# Patient Record
Sex: Female | Born: 1945
Health system: Southern US, Community
[De-identification: ages and names within clinical notes are randomized; demographics above are authoritative.]

## PROBLEM LIST (undated history)

## (undated) DIAGNOSIS — N952 Postmenopausal atrophic vaginitis: Secondary | ICD-10-CM

## (undated) DIAGNOSIS — N951 Menopausal and female climacteric states: Secondary | ICD-10-CM

## (undated) DIAGNOSIS — E559 Vitamin D deficiency, unspecified: Secondary | ICD-10-CM

## (undated) DIAGNOSIS — M810 Age-related osteoporosis without current pathological fracture: Secondary | ICD-10-CM

## (undated) DIAGNOSIS — K5730 Diverticulosis of large intestine without perforation or abscess without bleeding: Secondary | ICD-10-CM

## (undated) DIAGNOSIS — L920 Granuloma annulare: Secondary | ICD-10-CM

## (undated) HISTORY — PX: COLONOSCOPY: SHX174

## (undated) HISTORY — DX: Vitamin D deficiency, unspecified: E55.9

## (undated) HISTORY — DX: Diverticulosis of large intestine without perforation or abscess without bleeding: K57.30

## (undated) HISTORY — DX: Age-related osteoporosis without current pathological fracture: M81.0

## (undated) HISTORY — DX: Postmenopausal atrophic vaginitis: N95.2

## (undated) HISTORY — DX: Granuloma annulare: L92.0

## (undated) HISTORY — DX: Menopausal and female climacteric states: N95.1

---

## 1998-01-29 ENCOUNTER — Other Ambulatory Visit: Admission: RE | Admit: 1998-01-29 | Discharge: 1998-01-29 | Payer: Self-pay | Admitting: Obstetrics and Gynecology

## 1999-02-18 ENCOUNTER — Other Ambulatory Visit: Admission: RE | Admit: 1999-02-18 | Discharge: 1999-02-18 | Payer: Self-pay | Admitting: Obstetrics and Gynecology

## 2000-02-02 ENCOUNTER — Other Ambulatory Visit: Admission: RE | Admit: 2000-02-02 | Discharge: 2000-02-02 | Payer: Self-pay | Admitting: Obstetrics and Gynecology

## 2000-11-21 DIAGNOSIS — N951 Menopausal and female climacteric states: Secondary | ICD-10-CM

## 2000-11-21 HISTORY — DX: Menopausal and female climacteric states: N95.1

## 2001-02-21 ENCOUNTER — Other Ambulatory Visit: Admission: RE | Admit: 2001-02-21 | Discharge: 2001-02-21 | Payer: Self-pay | Admitting: Obstetrics and Gynecology

## 2001-08-23 HISTORY — PX: SKIN CANCER EXCISION: SHX779

## 2002-04-11 ENCOUNTER — Other Ambulatory Visit: Admission: RE | Admit: 2002-04-11 | Discharge: 2002-04-11 | Payer: Self-pay | Admitting: Obstetrics and Gynecology

## 2003-05-28 ENCOUNTER — Other Ambulatory Visit: Admission: RE | Admit: 2003-05-28 | Discharge: 2003-05-28 | Payer: Self-pay | Admitting: Obstetrics and Gynecology

## 2004-06-29 ENCOUNTER — Ambulatory Visit: Payer: Self-pay | Admitting: Internal Medicine

## 2004-06-30 ENCOUNTER — Encounter: Admission: RE | Admit: 2004-06-30 | Discharge: 2004-06-30 | Payer: Self-pay | Admitting: Internal Medicine

## 2004-07-08 ENCOUNTER — Other Ambulatory Visit: Admission: RE | Admit: 2004-07-08 | Discharge: 2004-07-08 | Payer: Self-pay | Admitting: Obstetrics and Gynecology

## 2005-07-23 DIAGNOSIS — N952 Postmenopausal atrophic vaginitis: Secondary | ICD-10-CM

## 2005-07-23 HISTORY — DX: Postmenopausal atrophic vaginitis: N95.2

## 2005-07-30 ENCOUNTER — Other Ambulatory Visit: Admission: RE | Admit: 2005-07-30 | Discharge: 2005-07-30 | Payer: Self-pay | Admitting: Obstetrics and Gynecology

## 2006-08-02 ENCOUNTER — Other Ambulatory Visit: Admission: RE | Admit: 2006-08-02 | Discharge: 2006-08-02 | Payer: Self-pay | Admitting: Obstetrics & Gynecology

## 2006-08-10 ENCOUNTER — Ambulatory Visit: Payer: Self-pay | Admitting: Internal Medicine

## 2006-08-15 ENCOUNTER — Ambulatory Visit: Payer: Self-pay | Admitting: Internal Medicine

## 2006-08-23 DIAGNOSIS — L920 Granuloma annulare: Secondary | ICD-10-CM

## 2006-08-23 HISTORY — DX: Granuloma annulare: L92.0

## 2007-03-14 ENCOUNTER — Ambulatory Visit: Payer: Self-pay | Admitting: Internal Medicine

## 2007-05-01 ENCOUNTER — Encounter: Payer: Self-pay | Admitting: Internal Medicine

## 2007-08-30 ENCOUNTER — Other Ambulatory Visit: Admission: RE | Admit: 2007-08-30 | Discharge: 2007-08-30 | Payer: Self-pay | Admitting: Obstetrics and Gynecology

## 2007-10-18 ENCOUNTER — Encounter: Payer: Self-pay | Admitting: Internal Medicine

## 2007-11-06 ENCOUNTER — Encounter (INDEPENDENT_AMBULATORY_CARE_PROVIDER_SITE_OTHER): Payer: Self-pay | Admitting: *Deleted

## 2008-03-06 ENCOUNTER — Ambulatory Visit: Payer: Self-pay | Admitting: Internal Medicine

## 2008-03-06 DIAGNOSIS — L920 Granuloma annulare: Secondary | ICD-10-CM | POA: Insufficient documentation

## 2008-03-06 DIAGNOSIS — L538 Other specified erythematous conditions: Secondary | ICD-10-CM

## 2008-03-06 DIAGNOSIS — E785 Hyperlipidemia, unspecified: Secondary | ICD-10-CM | POA: Insufficient documentation

## 2008-03-06 DIAGNOSIS — E559 Vitamin D deficiency, unspecified: Secondary | ICD-10-CM

## 2008-05-30 ENCOUNTER — Ambulatory Visit: Payer: Self-pay | Admitting: Internal Medicine

## 2008-05-30 DIAGNOSIS — M7580 Other shoulder lesions, unspecified shoulder: Secondary | ICD-10-CM

## 2008-05-30 DIAGNOSIS — M25819 Other specified joint disorders, unspecified shoulder: Secondary | ICD-10-CM | POA: Insufficient documentation

## 2008-10-01 ENCOUNTER — Other Ambulatory Visit: Admission: RE | Admit: 2008-10-01 | Discharge: 2008-10-01 | Payer: Self-pay | Admitting: Obstetrics and Gynecology

## 2009-06-13 ENCOUNTER — Encounter: Payer: Self-pay | Admitting: Internal Medicine

## 2009-06-13 ENCOUNTER — Ambulatory Visit: Payer: Self-pay | Admitting: Family Medicine

## 2009-12-23 ENCOUNTER — Ambulatory Visit: Payer: Self-pay | Admitting: Internal Medicine

## 2009-12-23 LAB — CONVERTED CEMR LAB
ALT: 14 units/L (ref 0–35)
AST: 26 units/L (ref 0–37)
Albumin: 4 g/dL (ref 3.5–5.2)
Alkaline Phosphatase: 87 units/L (ref 39–117)
BUN: 9 mg/dL (ref 6–23)
Basophils Absolute: 0 10*3/uL (ref 0.0–0.1)
Basophils Relative: 0.5 % (ref 0.0–3.0)
Bilirubin, Direct: 0.1 mg/dL (ref 0.0–0.3)
CO2: 30 meq/L (ref 19–32)
Calcium: 9.2 mg/dL (ref 8.4–10.5)
Chloride: 104 meq/L (ref 96–112)
Cholesterol: 210 mg/dL — ABNORMAL HIGH (ref 0–200)
Creatinine, Ser: 0.5 mg/dL (ref 0.4–1.2)
Direct LDL: 124.9 mg/dL
Eosinophils Absolute: 0.1 10*3/uL (ref 0.0–0.7)
Eosinophils Relative: 1.4 % (ref 0.0–5.0)
GFR calc non Af Amer: 132.88 mL/min (ref >60)
Glucose, Bld: 93 mg/dL (ref 70–99)
HCT: 39.5 % (ref 36.0–46.0)
HDL: 65.1 mg/dL (ref >39.00)
Hemoglobin: 13.2 g/dL (ref 12.0–15.0)
Lymphocytes Relative: 21 % (ref 12.0–46.0)
Lymphs Abs: 1.4 10*3/uL (ref 0.7–4.0)
MCHC: 33.5 g/dL (ref 30.0–36.0)
MCV: 85.7 fL (ref 78.0–100.0)
Monocytes Absolute: 0.4 10*3/uL (ref 0.1–1.0)
Monocytes Relative: 5.5 % (ref 3.0–12.0)
Neutro Abs: 4.9 10*3/uL (ref 1.4–7.7)
Neutrophils Relative %: 71.6 % (ref 43.0–77.0)
Platelets: 321 10*3/uL (ref 150.0–400.0)
Potassium: 4.9 meq/L (ref 3.5–5.1)
RBC: 4.61 M/uL (ref 3.87–5.11)
RDW: 14.2 % (ref 11.5–14.6)
Sodium: 141 meq/L (ref 135–145)
TSH: 1.18 microintl units/mL (ref 0.35–5.50)
Total Bilirubin: 0.8 mg/dL (ref 0.3–1.2)
Total CHOL/HDL Ratio: 3
Total Protein: 6.5 g/dL (ref 6.0–8.3)
Triglycerides: 55 mg/dL (ref 0.0–149.0)
VLDL: 11 mg/dL (ref 0.0–40.0)
WBC: 6.8 10*3/uL (ref 4.5–10.5)

## 2010-01-07 ENCOUNTER — Ambulatory Visit: Payer: Self-pay | Admitting: Internal Medicine

## 2010-01-07 DIAGNOSIS — K5730 Diverticulosis of large intestine without perforation or abscess without bleeding: Secondary | ICD-10-CM

## 2010-01-07 DIAGNOSIS — M810 Age-related osteoporosis without current pathological fracture: Secondary | ICD-10-CM

## 2010-01-07 HISTORY — DX: Diverticulosis of large intestine without perforation or abscess without bleeding: K57.30

## 2010-06-16 ENCOUNTER — Encounter: Payer: Self-pay | Admitting: Internal Medicine

## 2010-06-25 ENCOUNTER — Ambulatory Visit: Payer: Self-pay | Admitting: Internal Medicine

## 2010-06-25 DIAGNOSIS — L509 Urticaria, unspecified: Secondary | ICD-10-CM

## 2010-06-25 DIAGNOSIS — J069 Acute upper respiratory infection, unspecified: Secondary | ICD-10-CM | POA: Insufficient documentation

## 2010-08-23 HISTORY — PX: WRIST FRACTURE SURGERY: SHX121

## 2010-09-20 LAB — CONVERTED CEMR LAB
ALT: 13 units/L (ref 0–35)
AST: 23 units/L (ref 0–37)
Albumin: 4.1 g/dL (ref 3.5–5.2)
Alkaline Phosphatase: 80 units/L (ref 39–117)
BUN: 8 mg/dL (ref 6–23)
Basophils Absolute: 0 10*3/uL (ref 0.0–0.1)
Basophils Relative: 0.3 % (ref 0.0–3.0)
Bilirubin, Direct: 0.1 mg/dL (ref 0.0–0.3)
CO2: 31 meq/L (ref 19–32)
Calcium: 9.3 mg/dL (ref 8.4–10.5)
Chloride: 104 meq/L (ref 96–112)
Cholesterol, target level: 200 mg/dL
Cholesterol: 189 mg/dL (ref 0–200)
Creatinine, Ser: 0.6 mg/dL (ref 0.4–1.2)
Eosinophils Absolute: 0.1 10*3/uL (ref 0.0–0.7)
Eosinophils Relative: 1.2 % (ref 0.0–5.0)
GFR calc Af Amer: 131 mL/min
GFR calc non Af Amer: 108 mL/min
Glucose, Bld: 97 mg/dL (ref 70–99)
HCT: 40.1 % (ref 36.0–46.0)
HDL goal, serum: 40 mg/dL
HDL: 59.8 mg/dL (ref >39.0)
Hemoglobin: 13.7 g/dL (ref 12.0–15.0)
Hgb A1c MFr Bld: 6 % (ref 4.6–6.0)
LDL Cholesterol: 118 mg/dL — ABNORMAL HIGH (ref 0–99)
LDL Goal: 160 mg/dL
Lymphocytes Relative: 21.7 % (ref 12.0–46.0)
MCHC: 34.1 g/dL (ref 30.0–36.0)
MCV: 85.4 fL (ref 78.0–100.0)
Monocytes Absolute: 0.3 10*3/uL (ref 0.1–1.0)
Monocytes Relative: 5.5 % (ref 3.0–12.0)
Neutro Abs: 4.2 10*3/uL (ref 1.4–7.7)
Neutrophils Relative %: 71.3 % (ref 43.0–77.0)
Platelets: 302 10*3/uL (ref 150–400)
Potassium: 3.8 meq/L (ref 3.5–5.1)
RBC: 4.7 M/uL (ref 3.87–5.11)
RDW: 13.6 % (ref 11.5–14.6)
Sodium: 140 meq/L (ref 135–145)
TSH: 0.83 microintl units/mL (ref 0.35–5.50)
Total Bilirubin: 0.9 mg/dL (ref 0.3–1.2)
Total CHOL/HDL Ratio: 3.2
Total Protein: 6.9 g/dL (ref 6.0–8.3)
Triglycerides: 57 mg/dL (ref 0–149)
VLDL: 11 mg/dL (ref 0–40)
WBC: 5.9 10*3/uL (ref 4.5–10.5)

## 2010-09-22 NOTE — Assessment & Plan Note (Signed)
Summary: rash around waist/kn   Vital Signs:  Patient profile:   65 year old female Height:      64 inches Weight:      159.2 pounds BMI:     27.43 Temp:     98.0 degrees F oral Pulse rate:   72 / minute Resp:     15 per minute BP sitting:   130 / 88  (left arm) Cuff size:   large  Vitals Entered By: [name] CMA (June 25, 2010 3:56 PM) CC: 1.) Rash on right side of waist(no change in daily routines)   2.) Cough and drainage, URI symptoms   CC:  1.) Rash on right side of waist(no change in daily routines)   2.) Cough and drainage and URI symptoms.  History of Present Illness:       Onset 11/02 of "welts"; she applied Neosporin.  The patient reports hives and welts, but denies blisters, ulcers, oozing,  pain and tenderness.  The rash is located on the abdomen but minor lesions L buttock & R jaw.  The rash is worse with heat of shower.  Associated symptoms include sore throat 10/29 & 10/30.  The patient denies the following symptoms: facial swelling, tongue swelling, difficulty breathing, abdominal pain, nausea, vomiting, diarrhea, dysuria, and eye symptoms.  The patient reports a history of recent infection ( see below).   RTI  Symptoms      The patient also presents with RTI  symptoms since 10/27; onset as dry cough.  The patient reports dry cough, but denies nasal congestion, purulent nasal discharge, and earache.  Associated symptoms include low-grade fever (<100.5 degrees) over weekend.  The patient denies dyspnea and wheezing.  The patient also reports sneezing.  The patient denies itchy watery eyes and headache ( minor over weekend only).  Risk factors for Strep sinusitis include tooth pain in L maxillary area.  The patient denies the following risk factors for Strep sinusitis: unilateral facial pain and tender adenopathy.  Rx: Nyquil   Current Medications (verified): 1)  Vitamin D 62952 Unit  Caps (Ergocalciferol) .Marland Kitchen.. 1 By Mouth Weekly 2)  Daily Multi   Tabs (Multiple  Vitamins-Minerals) .Marland Kitchen.. 1 By Mouth Once Daily 3)  Calcium 1500 Mg  Tabs (Calcium Carbonate) .Marland Kitchen.. 1 By Mouth Every Other Day  Allergies: 1)  ! Codeine 2)  Sulfa  Physical Exam  General:  well-nourished,in no acute distress; alert,appropriate and cooperative throughout examination Eyes:  No corneal or conjunctival inflammation noted.Perrla.  Vision grossly normal. Ears:  External ear exam shows no significant lesions or deformities.  Otoscopic examination reveals clear canals, tympanic membranes are intact bilaterally without bulging, retraction, inflammation or discharge. Hearing is grossly normal bilaterally. Nose:  External nasal examination shows no deformity or inflammation. Nasal mucosa are pink and moist without lesions or exudates. Mouth:  Oral mucosa and oropharynx without lesions or exudates.  Teeth in good repair. Lungs:  Normal respiratory effort, chest expands symmetrically. Lungs are clear to auscultation, no crackles or wheezes. Heart:  Normal rate and regular rhythm. S1 and S2 normal without gallop, murmur, click, rub or other extra sounds. Skin:  Linearly distributedd urticaria R flank , minimal lesion R mandible. Dermatographia elicitable  Cervical Nodes:  No lymphadenopathy noted Axillary Nodes:  No palpable lymphadenopathy Psych:  memory intact for recent and remote, normally interactive, and good eye contact.     Impression & Recommendations:  Problem # 1:  URTICARIA (ICD-708.9) from Nyquil  or RTI  Problem #  2:  URI (ICD-465.9)  Problem # 3:  BRONCHITIS-ACUTE (ICD-466.0)  Her updated medication list for this problem includes:    Azithromycin 250 Mg Tabs (Azithromycin) .Marland Kitchen... As per pack  Complete Medication List: 1)  Vitamin D 43329 Unit Caps (Ergocalciferol) .Marland Kitchen.. 1 by mouth weekly 2)  Daily Multi Tabs (Multiple vitamins-minerals) .Marland Kitchen.. 1 by mouth once daily 3)  Calcium 1500 Mg Tabs (Calcium carbonate) .Marland Kitchen.. 1 by mouth every other day 4)  Azithromycin 250 Mg  Tabs (Azithromycin) .... As per pack 5)  Hydroxyzine Pamoate 25 Mg Caps (Hydroxyzine pamoate) .Marland Kitchen.. 1 every 4-6 hrs as needed for itching 6)  Prednisone 20 Mg Tabs (Prednisone) .Marland Kitchen.. 1 two times a day with food  Patient Instructions: 1)  Stop the Nyquil. 2)  Drink as much  NON dairy fluid as you can tolerate for the next few days. Prescriptions: PREDNISONE 20 MG TABS (PREDNISONE) 1 two times a day with food  #10 x 0   Entered and Authorized by:   Marga Melnick MD   Signed by:   Marga Melnick MD on 06/25/2010   Method used:   Print then Give to Patient   RxID:   [id number] HYDROXYZINE PAMOATE 25 MG CAPS (HYDROXYZINE PAMOATE) 1 every 4-6 hrs as needed for itching  #30 x 0   Entered and Authorized by:   Marga Melnick MD   Signed by:   Marga Melnick MD on 06/25/2010   Method used:   Print then Give to Patient   RxID:   [id number] AZITHROMYCIN 250 MG TABS (AZITHROMYCIN) as per pack  #1 x 0   Entered and Authorized by:   Marga Melnick MD   Signed by:   Marga Melnick MD on 06/25/2010   Method used:   Print then Give to Patient   RxID:   [id number]    Orders Added: 1)  Est. Patient Level IV [60737]

## 2010-09-22 NOTE — Assessment & Plan Note (Signed)
Summary: cpx//ph   Vital Signs:  Patient profile:   65 year old female Height:      64.25 inches Weight:      165.2 pounds BMI:     28.24 Temp:     97.8 degrees F oral Pulse rate:   79 / minute Resp:     16 per minute BP sitting:   128 / 86  (left arm) Cuff size:   large  Vitals Entered By: [name] (Jan 07, 2010 8:30 AM)  CC: Lipid Management Comments REVIEWED MED LIST, PATIENT AGREED DOSE AND INSTRUCTION CORRECT  RECHECK ON B/P- 128/86   CC:  Lipid Management.  History of Present Illness: Madison Wright is here for a physical; she is having intermittent R shoulder pain .  The patient reports impaired ROM, but denies numbness, weakness, tingling, locking, stiffness, swelling, and redness.  The pain is located in the right shoulder.  The pain began gradually and without injury.  The patient describes the pain as intermittent and sharp.  The pain is better with NSAIDS(up to 3 ibuprofen as needed); it is worse with lifting.Similar symptos in 05/2008  Lipid Management History:      Positive NCEP/ATP III risk factors include female age 60 years old or older.  Negative NCEP/ATP III risk factors include no history of early menopause without estrogen hormone replacement, non-diabetic, HDL cholesterol greater than 60, no family history for ischemic heart disease, non-tobacco-user status, non-hypertensive, no ASHD (atherosclerotic heart disease), no prior stroke/TIA, no peripheral vascular disease, and no history of aortic aneurysm.     Preventive Screening-Counseling & Management  Alcohol-Tobacco     Smoking Status: never  Caffeine-Diet-Exercise     Does Patient Exercise: yes  Allergies: 1)  ! Codeine 2)  Sulfa  Past History:  Past Medical History: Granuloma Annulare, Dr Venancio Poisson Hyperlipidemia: NMR Panel 12/07: LDL 170 with 1082 total particles & 266 small dense particles, HDL 78,TG 30.. [place] goal = < 160. Diverticulosis, colon Osteopenia, Amedeo Kinsman  MD Vitamin D deficiency  Past Surgical History: G 0 P 0, Altamese Dilling MD,Gyn Denies surgical history Colonoscopy 2004: Tics  Family History: Father: COAD,smoker ages  59-56 Mother: arthritis, Osteoporosis Siblings: none Paunt colon  cancer;P aunt DM, Paternal FH CAD  Social History: No diet Occupation: Aeronautical engineer Never Smoked Alcohol use-no Regular exercise-yes: walking 30 min 2X/week  Review of Systems  The patient denies anorexia, fever, weight loss, weight gain, vision loss, decreased hearing, hoarseness, chest pain, syncope, dyspnea on exertion, peripheral edema, prolonged cough, headaches, hemoptysis, abdominal pain, melena, hematochezia, severe indigestion/heartburn, hematuria, incontinence, suspicious skin lesions, depression, unusual weight change, abnormal bleeding, enlarged lymph nodes, and angioedema.   MS:  Occasional R hip pain with intermittent radiation to R thigh. No BMD due to insurance coverage.Marland Kitchen  Physical Exam  General:  well-nourished; alert,appropriate and cooperative throughout examination Head:  Normocephalic and atraumatic without obvious abnormalities. No apparent alopecia or balding. Eyes:  No corneal or conjunctival inflammation noted.Perrla. Funduscopic exam benign, without hemorrhages, exudates or papilledema.  Ears:  External ear exam shows no significant lesions or deformities.  Otoscopic examination reveals clear canals, tympanic membranes are intact bilaterally without bulging, retraction, inflammation or discharge. Hearing is grossly normal bilaterally. Nose:  External nasal examination shows no deformity or inflammation. Nasal mucosa are pink and moist without lesions or exudates. Mouth:  Oral mucosa and oropharynx without lesions or exudates.  Teeth in good repair. Neck:  No deformities, masses, or tenderness noted. Lungs:  Normal respiratory effort, chest expands symmetrically. Lungs are clear to auscultation, no crackles or  wheezes. Heart:  Normal rate and regular rhythm. S1 and S2 normal without gallop, murmur, click, rub .S4 with slurring Abdomen:  Bowel sounds positive,abdomen soft and non-tender without masses, organomegaly or hernias noted. Genitalia:  as per Gyn Msk:  Asymmetry of thoracic musculature ,R > L Pulses:  R and L carotid,radial,dorsalis pedis and posterior tibial pulses are full and equal bilaterally Extremities:  No clubbing, cyanosis, edema, or deformity noted with normal full range of motion of all joints.   Minimal discomfort with ROM of R shoulder Neurologic:  alert & oriented X3, strength normal in all extremities, and DTRs symmetrical and normal.   Skin:  Scattered Granuloma Annulare lesions  Cervical Nodes:  No lymphadenopathy noted Axillary Nodes:  No palpable lymphadenopathy Psych:  memory intact for recent and remote, normally interactive, and good eye contact.     Impression & Recommendations:  Problem # 1:  ROUTINE GENERAL MEDICAL EXAM@HEALTH  CARE FACL (ICD-V70.0)  Problem # 2:  SHOULDER IMPINGEMENT SYNDROME, RIGHT (ICD-726.2)  Orders: Physical Therapy Referral (PT)  Problem # 3:  OSTEOPENIA (ICD-733.90)  Problem # 4:  VITAMIN D DEFICIENCY (ICD-268.9)  Problem # 5:  HYPERLIPIDEMIA (ICD-272.4) LDL goal = < 160  Problem # 6:  GRANULOMA ANNULARE (ICD-695.89)  Complete Medication List: 1)  Vitamin D 16109 Unit Caps (Ergocalciferol) .Marland Kitchen.. 1 by mouth weekly 2)  Daily Multi Tabs (Multiple vitamins-minerals) .Marland Kitchen.. 1 by mouth once daily 3)  Calcium 1500 Mg Tabs (Calcium carbonate) .Marland Kitchen.. 1 by mouth every other day 4)  Voltaren 1 % Gel (Diclofenac sodium) .... Apply two times a day  Lipid Assessment/Plan:      Based on NCEP/ATP III, the patient's risk factor category is "0-1 risk factors".  The patient's lipid goals are as follows: Total cholesterol goal is 200; LDL cholesterol goal is 160; HDL cholesterol goal is 40; Triglyceride goal is 150.  Her LDL cholesterol goal has been  met.    Patient Instructions: 1)  Consider Glucosamine 1500 mg once daily for 3 months on  & then 2 months off for the shoulder.

## 2010-09-22 NOTE — Letter (Signed)
Summary: Cancer Screening/[facility] Personalized Risk Profile  Cancer Screening/[facility] Personalized Risk Profile   Imported By: Lanelle Bal 01/13/2010 12:16:59  _____________________________________________________________________  External Attachment:    Type:   Image     Comment:   External Document

## 2010-10-30 ENCOUNTER — Encounter: Payer: Self-pay | Admitting: Internal Medicine

## 2010-10-30 ENCOUNTER — Other Ambulatory Visit (INDEPENDENT_AMBULATORY_CARE_PROVIDER_SITE_OTHER): Payer: Self-pay

## 2010-10-30 DIAGNOSIS — Z1211 Encounter for screening for malignant neoplasm of colon: Secondary | ICD-10-CM

## 2010-10-30 LAB — CONVERTED CEMR LAB
OCCULT 1: NEGATIVE
OCCULT 2: NEGATIVE
OCCULT 3: NEGATIVE

## 2010-11-11 ENCOUNTER — Telehealth: Payer: Self-pay | Admitting: Internal Medicine

## 2010-11-11 NOTE — Telephone Encounter (Signed)
Phone note completed ° °

## 2010-11-19 NOTE — Letter (Signed)
Summary: Results Follow up Letter  [place] [facility]  [address] [place], [place] [address]   Phone: [phone]  Fax: [phone]    11/11/2010 MRN: [id number]  Madison Wright [address] [place], [place]  [address]  [place]  Dear [name],  The following are the results of your recent test(s):  Test         Result    Pap Smear:        Normal _____  Not Normal _____ Comments: ______________________________________________________ Cholesterol: LDL(Bad cholesterol):         Your goal is less than:         HDL (Good cholesterol):       Your goal is more than: Comments:  ______________________________________________________ Mammogram:        Normal _____  Not Normal _____ Comments:  ___________________________________________________________________ Hemoccult:        Normal __x___  Not normal _______ Comments:    _____________________________________________________________________ Other Tests:    We routinely do not discuss normal results over the telephone.  If you desire a copy of the results, or you have any questions about this information we can discuss them at your next office visit.   Sincerely,

## 2010-12-03 ENCOUNTER — Telehealth: Payer: Self-pay | Admitting: Internal Medicine

## 2010-12-03 NOTE — Telephone Encounter (Signed)
Patient has CPX on 03/17/2011,  Has labs for 03-12-2011      What orders and codes do you need?    thanks

## 2010-12-03 NOTE — Telephone Encounter (Signed)
Lipid,Hep,BMP,CBCD,TSH,Vit.D, Urine, Stool Cards V70.0/272.4/268.9

## 2010-12-04 NOTE — Telephone Encounter (Signed)
Added info to 7/18 labs

## 2011-01-29 ENCOUNTER — Telehealth: Payer: Self-pay | Admitting: Internal Medicine

## 2011-01-29 NOTE — Telephone Encounter (Signed)
error 

## 2011-01-30 ENCOUNTER — Encounter: Payer: Self-pay | Admitting: Internal Medicine

## 2011-02-01 ENCOUNTER — Encounter: Payer: Self-pay | Admitting: Internal Medicine

## 2011-02-01 ENCOUNTER — Ambulatory Visit (INDEPENDENT_AMBULATORY_CARE_PROVIDER_SITE_OTHER): Payer: Managed Care, Other (non HMO) | Admitting: Internal Medicine

## 2011-02-01 DIAGNOSIS — S52509A Unspecified fracture of the lower end of unspecified radius, initial encounter for closed fracture: Secondary | ICD-10-CM

## 2011-02-01 DIAGNOSIS — S52599A Other fractures of lower end of unspecified radius, initial encounter for closed fracture: Secondary | ICD-10-CM

## 2011-02-01 DIAGNOSIS — M899 Disorder of bone, unspecified: Secondary | ICD-10-CM

## 2011-02-01 DIAGNOSIS — M949 Disorder of cartilage, unspecified: Secondary | ICD-10-CM

## 2011-02-01 DIAGNOSIS — E559 Vitamin D deficiency, unspecified: Secondary | ICD-10-CM

## 2011-02-01 NOTE — Progress Notes (Signed)
  Subjective:    Patient ID: Madison Wright, female    DOB: 1946-01-08, 65 y.o.   MRN: [id number]  HPI  Onset:6/8; she tripped on [place]  @ [place]. There was no Cardiac or Neuro prodrome prior to fall. Trigger/injury:L distal radius fracture Pain quality: now aching Pain severity:up to 5 Exacerbating factors:none; wrist immobilized Review of systems: Constitutional: fever, chills, sweats :no Skin:rash, color change:no Neuro:weakness:no; numbness and tingling:no Treatment/response:Vicodin X 5 over weekend; now NSAIDS up to 5/day Films reviewed ; no significant displacement  PMH of Osteopenia    Review of Systems     Objective:   Physical Exam Gen.: Healthy and well-nourished in appearance. Alert, appropriate and cooperative throughout exam. Lungs: Normal respiratory effort; chest expands symmetrically. Lungs are clear to auscultation without rales, wheezes, or increased work of breathing. Heart: Normal rate and rhythm. Normal S1 and S2. No gallop, click, or rub. No murmur. No clubbing, cyanosis, edema, or deformity noted.  LUE in wrap & sling. Nail health  good. Neurologic: Alert and oriented x3.        Skin: Intact without suspicious lesions or rashes.Abrasions L knee w/o cellulitis Lymph: No cervical, axillary lymphadenopathy present. Psych: Mood and affect are normal. Normally interactive                                                                                         Assessment & Plan:  #1 fracture L distal radius Plan : Ortho referral

## 2011-02-01 NOTE — Patient Instructions (Addendum)
Ortho appt will be scheduled through [employer]; you will be contacted via cell phone  [phone])

## 2011-02-01 NOTE — Assessment & Plan Note (Signed)
Vitamin D3 50,000 IU every other week as per Rock Nephew, PA

## 2011-02-02 ENCOUNTER — Encounter (HOSPITAL_BASED_OUTPATIENT_CLINIC_OR_DEPARTMENT_OTHER)
Admission: RE | Admit: 2011-02-02 | Discharge: 2011-02-02 | Disposition: A | Discharge status: Home / Self Care | Payer: Managed Care, Other (non HMO) | Source: Ambulatory Visit | Attending: Orthopedic Surgery | Admitting: Orthopedic Surgery

## 2011-02-03 ENCOUNTER — Ambulatory Visit (HOSPITAL_BASED_OUTPATIENT_CLINIC_OR_DEPARTMENT_OTHER)
Admission: RE | Admit: 2011-02-03 | Discharge: 2011-02-03 | Disposition: A | Discharge status: Home / Self Care | Payer: Managed Care, Other (non HMO) | Source: Ambulatory Visit | Attending: Orthopedic Surgery | Admitting: Orthopedic Surgery

## 2011-02-03 DIAGNOSIS — X58XXXA Exposure to other specified factors, initial encounter: Secondary | ICD-10-CM | POA: Insufficient documentation

## 2011-02-03 DIAGNOSIS — Z01812 Encounter for preprocedural laboratory examination: Secondary | ICD-10-CM | POA: Insufficient documentation

## 2011-02-03 DIAGNOSIS — S52599A Other fractures of lower end of unspecified radius, initial encounter for closed fracture: Secondary | ICD-10-CM | POA: Insufficient documentation

## 2011-02-03 LAB — POCT HEMOGLOBIN-HEMACUE: Hemoglobin: 13.4 g/dL (ref 12.0–15.0)

## 2011-02-06 NOTE — Op Note (Signed)
NAMECORINTHIAN, Madison Wright NO.:  [id number]  MEDICAL RECORD NO.:  [id number]  LOCATION:                                 FACILITY:  PHYSICIAN:  Feliberto Gottron. Turner Daniels, M.D.        DATE OF BIRTH:  DATE OF PROCEDURE:  02/03/2011 DATE OF DISCHARGE:                              OPERATIVE REPORT   PREOPERATIVE DIAGNOSIS:  Left distal radius fracture, displaced.  POSTOPERATIVE DIAGNOSIS:  Left distal radius fracture, displaced.  PROCEDURE:  Open reduction and internal fixation, left distal radius fracture, using a small left DePuy DVR plate.  SURGEON:  Feliberto Gottron. Turner Daniels, MD.  FIRST ASSISTANT:  Shirl Harris, PA.  ANESTHETIC:  Ax block plus general LMA.  ESTIMATED BLOOD LOSS:  Minimal.  FLUID REPLACEMENT:  800 mL of crystalloid.  DRAINS PLACED:  None.  TOURNIQUET TIME:  39 minutes.  INDICATIONS FOR PROCEDURE:  A 65 year old woman who sustained a displaced left distal radius fracture last week while on vacation, she returned to [facility] and came to our office with radiographs showing an angulated displaced left distal radius fracture and history of prior distal radius fracture when she was in her 11s.  She is 65 now.  In any event, because of the angulation and shortening, she was taken to the operating room for open reduction and internal fixation.  She has about 5 mm ulnar nerve positive.  Risks and benefits of surgery have been discussed, questions answered.  DESCRIPTION OF PROCEDURE:  The patient was identified by armband, received preoperative IV antibiotics in the holding area at [facility], taken to the operating room #5, appropriate site monitors were attached, and general LMA anesthesia was induced.  Prior to going back, she did have an axillary block anesthetic placed in the block area.  Tourniquet was applied to the left forearm and the left upper extremity was prepped and draped in sterile fashion from the fingertips to the tourniquet.   Time-out procedure performed.  Limb wrapped with an Esmarch bandage.  Tourniquet inflated to 250 mmHg.  We began the procedure by making a volar longitudinal incision starting at the wrist flexion crease and following the course of the FCR tendon for a distance of about 7 or 8 cm.  Small bleeders in the skin and subcutaneous tissue identified and cauterized.  We opened the sheath of the FCR tendon, retracted the FCR radially and went to the back side of the sheath down onto the pronator quadratus muscle.  This was taken down off of its radial origin, exposing the distal radius and the distal radius fracture.  Under direct visualization, the fracture was reduced with traction and volar pressure on the distal fragment.  This allowed [place] to provisionally placed a small DVR plate on the fracture site.  At this time, we noted that the old fracture given some rotation of the distal radius, so the stem of the small DVR plate was angularly rotated about 10 degrees using bending irons allowing a more confirmation fit. Satisfied with the fit, the plate was provisionally fixed proximally using the glide hole and a 12-mm 3.5 screw.  Distally, we provisionally fixed with  a guidewire and then C-arm images were taken to confirm good reduction of the fracture neutral angulation and that the plate was not in the distal RU joint.  Satisfied with the position of plate and the guidewire, we then filled the distal holes and a small plate with lag screws to the proximal holes in the distal aspect of the plate with pegs and then filled the holes proximal to the fracture site with two more bicortical lag screws.  Final C-arm images were taken confirming good position of the plate and then none of the pins were in the joint.  The wrist was taken through a full range of motion, no crepitus was noted. At this point, the tourniquet was let down.  Small bleeders identified and cauterized.  The subcutaneous tissue  closed with running 3-0 Vicryl suture and the subcuticular tissue with running 3-0 Vicryl suture as well.  Dressing of Xeroform, 4x4s, Webril, and a well-formed splint was then applied.  Tourniquet let down.  The patient awakened, extubated, and taken to the recovery room without difficulty.     Feliberto Gottron. Turner Daniels, M.D.     Ovid Curd  D:  02/03/2011  T:  02/04/2011  Job:  [id number]  Electronically Signed by Gean Birchwood M.D. on 02/06/2011 08:45:17 PM

## 2011-03-09 ENCOUNTER — Other Ambulatory Visit: Payer: Self-pay | Admitting: Internal Medicine

## 2011-03-09 DIAGNOSIS — Z0000 Encounter for general adult medical examination without abnormal findings: Secondary | ICD-10-CM

## 2011-03-09 DIAGNOSIS — E785 Hyperlipidemia, unspecified: Secondary | ICD-10-CM

## 2011-03-09 DIAGNOSIS — E559 Vitamin D deficiency, unspecified: Secondary | ICD-10-CM

## 2011-03-10 ENCOUNTER — Other Ambulatory Visit (INDEPENDENT_AMBULATORY_CARE_PROVIDER_SITE_OTHER): Payer: Managed Care, Other (non HMO)

## 2011-03-10 DIAGNOSIS — E785 Hyperlipidemia, unspecified: Secondary | ICD-10-CM

## 2011-03-10 DIAGNOSIS — E559 Vitamin D deficiency, unspecified: Secondary | ICD-10-CM

## 2011-03-10 DIAGNOSIS — Z0000 Encounter for general adult medical examination without abnormal findings: Secondary | ICD-10-CM

## 2011-03-10 LAB — BASIC METABOLIC PANEL
BUN: 10 mg/dL (ref 6–23)
CO2: 27 mEq/L (ref 19–32)
Calcium: 9.1 mg/dL (ref 8.4–10.5)
Chloride: 104 mEq/L (ref 96–112)
Creatinine, Ser: 0.5 mg/dL (ref 0.4–1.2)
GFR: 126.5 mL/min (ref >60.00)
Glucose, Bld: 110 mg/dL — ABNORMAL HIGH (ref 70–99)
Potassium: 4.1 mEq/L (ref 3.5–5.1)
Sodium: 139 mEq/L (ref 135–145)

## 2011-03-10 LAB — CBC WITH DIFFERENTIAL/PLATELET
Basophils Absolute: 0.1 10*3/uL (ref 0.0–0.1)
Basophils Relative: 0.5 % (ref 0.0–3.0)
Eosinophils Absolute: 0 10*3/uL (ref 0.0–0.7)
Hemoglobin: 13.3 g/dL (ref 12.0–15.0)
Lymphocytes Relative: 10.1 % — ABNORMAL LOW (ref 12.0–46.0)
MCHC: 33.4 g/dL (ref 30.0–36.0)
MCV: 85.3 fl (ref 78.0–100.0)
Monocytes Absolute: 0.5 10*3/uL (ref 0.1–1.0)
Neutro Abs: 10.3 10*3/uL — ABNORMAL HIGH (ref 1.4–7.7)
Neutrophils Relative %: 84.8 % — ABNORMAL HIGH (ref 43.0–77.0)
Platelets: 306 10*3/uL (ref 150.0–400.0)
RBC: 4.65 Mil/uL (ref 3.87–5.11)
RDW: 14.7 % — ABNORMAL HIGH (ref 11.5–14.6)

## 2011-03-10 LAB — LIPID PANEL
LDL Cholesterol: 106 mg/dL — ABNORMAL HIGH (ref 0–99)
Total CHOL/HDL Ratio: 3
Triglycerides: 43 mg/dL (ref 0.0–149.0)
VLDL: 8.6 mg/dL (ref 0.0–40.0)

## 2011-03-10 LAB — HEPATIC FUNCTION PANEL
ALT: 16 U/L (ref 0–35)
AST: 23 U/L (ref 0–37)
Albumin: 4.5 g/dL (ref 3.5–5.2)
Alkaline Phosphatase: 92 U/L (ref 39–117)
Total Bilirubin: 1 mg/dL (ref 0.3–1.2)
Total Protein: 7.7 g/dL (ref 6.0–8.3)

## 2011-03-10 LAB — TSH: TSH: 0.52 u[IU]/mL (ref 0.35–5.50)

## 2011-03-10 NOTE — Progress Notes (Signed)
Labs only

## 2011-03-13 LAB — VITAMIN D 1,25 DIHYDROXY
Vitamin D 1, 25 (OH)2 Total: 64 pg/mL (ref 18–72)
Vitamin D2 1, 25 (OH)2: 33 pg/mL
Vitamin D3 1, 25 (OH)2: 31 pg/mL

## 2011-03-15 LAB — HEMOGLOBIN A1C: Hgb A1c MFr Bld: 6.1 % (ref 4.6–6.5)

## 2011-03-17 ENCOUNTER — Encounter: Payer: Self-pay | Admitting: Internal Medicine

## 2011-03-23 ENCOUNTER — Encounter: Payer: Self-pay | Admitting: Internal Medicine

## 2011-03-23 ENCOUNTER — Ambulatory Visit (INDEPENDENT_AMBULATORY_CARE_PROVIDER_SITE_OTHER): Payer: Managed Care, Other (non HMO) | Admitting: Internal Medicine

## 2011-03-23 DIAGNOSIS — E559 Vitamin D deficiency, unspecified: Secondary | ICD-10-CM

## 2011-03-23 DIAGNOSIS — D72829 Elevated white blood cell count, unspecified: Secondary | ICD-10-CM

## 2011-03-23 DIAGNOSIS — E785 Hyperlipidemia, unspecified: Secondary | ICD-10-CM

## 2011-03-23 DIAGNOSIS — R7309 Other abnormal glucose: Secondary | ICD-10-CM

## 2011-03-23 DIAGNOSIS — R42 Dizziness and giddiness: Secondary | ICD-10-CM

## 2011-03-23 NOTE — Progress Notes (Signed)
  Subjective:    Patient ID: Madison Wright, female    DOB: 1945/11/02, 65 y.o.   MRN: [id number]  HPI   Lab studies were reviewed. LDL is minimally elevated, but the HDL is protective at 75.8. There is no significant family history or premature coronary disease. She denies chest pain, palpitations, dyspnea or claudication.  Fasting blood sugar was mildly elevated at 110. She denies excessive thirst, hunger, or urination. A paternal aunt had  Diabetes. A1c was 6.1%  Her white blood count was 12,200. She denies fever, chills, sweats purulent secretions, dysuria, pyuria or hematuria. She did have a urinary tract infection in May of this year.  TSH is low normal at 0.52; it has ranged from 0.5-1.18 in the last 3 years.    Review of Systems For 2 weeks she's had intermittent dizziness when she rises up in bed. This does not occur when she turns from side to side. She's had no ear pain, discharge, hearing loss, or ringing in the ears. This has responded to over-the-counter motion sickness medication.   Objective:   Physical Exam Gen. appearance: Well-nourished, in no distress Eyes: Extraocular motion intact, field of vision normal, vision grossly intact, no nystagmus ENT: Canals clear, tympanic membranes normal, tuning fork exam normal, hearing grossly normal Neck: Normal range of motion, no masses, normal thyroid Cardiovascular: Rate and rhythm normal; no gallops or extra heart sounds. S4 w/o murmur Muscle skeletal: Range of motion, tone, &  strength normal Neuro:no cranial nerve deficit, deep tendon  reflexes normal, gait normal. Romberg negative ; finger to nose normal. She lay back and set up without symptoms. Lymph: No cervical or axillary LA Skin: Warm and dry without suspicious lesions or rashes Psych: no anxiety or mood change. Normally interactive and cooperative.        Assessment & Plan:  #1 fasting hyperglycemia in context family history diabetes in a paternal aunt  #2 minimal  elevation of LDL; HDL is protective  #3 leukocytosis with no history to suggest localizing signs of infection  #4 dizziness, postural. This is responded to over-the-counter medications.  Plan: #1 monitor  A1c every 6 months  #2 repeat the CBC and differential to assess the white count and differential.

## 2011-03-23 NOTE — Assessment & Plan Note (Signed)
LDL 106; HDL 75.80

## 2011-03-23 NOTE — Patient Instructions (Addendum)
Diabetes Monitor  The A1c test is used primarily to monitor the glucose control of diabetics over time. The goal of those with diabetes is to keep their blood glucose levels as close to normal as possible. This helps to minimize the complications caused by chronically elevated glucose levels, such as progressive damage to body organs like the kidneys, eyes, cardiovascular system, and nerves. The A1c test gives a picture of the average amount of glucose in the blood over the last few months. It can help a patient and his doctor know if the measures they are taking to control the patient's diabetes are successful or need to be adjusted.     NORMAL VALUES  Non diabetic adults: 5 %-6.1%  Good diabetic control: 6.2-6.4 %  Fair diabetic control: 6.5-7%  Poor diabetic control: greater than 7 % ( except with additional factors such as  advanced age; significant coronary or neurologic disease,etc). Check the A1c every 6 months as  it is < 6.5%. Eat a low-fat diet with lots of fruits and vegetables, up to 7-9 servings per day. Avoid obesity; your goal is waist measurement <35 inches.Consume less than 30 grams of sugar per day from foods & drinks with High Fructose Corn Sugar as #1,2,3 or # 4 on label. Follow the low carb nutrition program in [employer] as closely as possible to prevent Diabetes progression & complications. White carbohydrates (potatoes, rice, bread, and pasta) have a high spike of sugar and a high load of sugar. For example a  baked potato has a cup of sugar and a  french fry  2 teaspoons of sugar. Yams, wild  rice, whole grained bread &  wheat pasta have been much lower spike and load of  sugar. Portions should be the size of a deck of cards or your palm.   Check TSH in 6 months.  Go to Web MD for Benign  Positional Vertigo.Isometric exercises prior to standing up from bed as discussed.

## 2011-03-24 LAB — CBC WITH DIFFERENTIAL/PLATELET
Basophils Absolute: 0 10*3/uL (ref 0.0–0.1)
Basophils Relative: 0.4 % (ref 0.0–3.0)
Eosinophils Absolute: 0.1 10*3/uL (ref 0.0–0.7)
HCT: 40 % (ref 36.0–46.0)
Hemoglobin: 13 g/dL (ref 12.0–15.0)
Lymphocytes Relative: 24 % (ref 12.0–46.0)
Lymphs Abs: 1.9 10*3/uL (ref 0.7–4.0)
MCHC: 32.5 g/dL (ref 30.0–36.0)
MCV: 85.8 fl (ref 78.0–100.0)
Monocytes Absolute: 0.4 10*3/uL (ref 0.1–1.0)
Monocytes Relative: 4.7 % (ref 3.0–12.0)
Neutro Abs: 5.4 10*3/uL (ref 1.4–7.7)
Neutrophils Relative %: 69.3 % (ref 43.0–77.0)
Platelets: 309 10*3/uL (ref 150.0–400.0)
RBC: 4.66 Mil/uL (ref 3.87–5.11)
RDW: 15.8 % — ABNORMAL HIGH (ref 11.5–14.6)
WBC: 7.8 10*3/uL (ref 4.5–10.5)

## 2011-04-13 ENCOUNTER — Encounter: Payer: Self-pay | Admitting: Internal Medicine

## 2011-11-16 LAB — HM PAP SMEAR: HM Pap smear: NEGATIVE

## 2012-02-29 ENCOUNTER — Telehealth: Payer: Self-pay | Admitting: Internal Medicine

## 2012-02-29 MED ORDER — ZOSTER VACCINE LIVE 19400 UNT/0.65ML ~~LOC~~ SOLR: 0.6500 mL | Freq: Once | SUBCUTANEOUS | Status: DC

## 2012-02-29 NOTE — Telephone Encounter (Signed)
Patient called insurance and they will only cover vaccination for shingles when given by pharmacy. Please send a prescription to  [facility] ph# [phone] [facility] - [place], [place] - [address] Added to patients demographics  Per call from patient they do have in stock

## 2012-02-29 NOTE — Telephone Encounter (Signed)
RX sent

## 2012-03-02 ENCOUNTER — Ambulatory Visit: Payer: Managed Care, Other (non HMO)

## 2012-05-02 ENCOUNTER — Ambulatory Visit (INDEPENDENT_AMBULATORY_CARE_PROVIDER_SITE_OTHER): Payer: BC Managed Care – PPO | Admitting: Internal Medicine

## 2012-05-02 ENCOUNTER — Encounter: Payer: Self-pay | Admitting: Internal Medicine

## 2012-05-02 VITALS — BP 130/80 | HR 82 | Temp 97.8°F | Resp 12 | Ht 64.0 in | Wt 147.8 lb

## 2012-05-02 DIAGNOSIS — E785 Hyperlipidemia, unspecified: Secondary | ICD-10-CM

## 2012-05-02 DIAGNOSIS — M899 Disorder of bone, unspecified: Secondary | ICD-10-CM

## 2012-05-02 DIAGNOSIS — L920 Granuloma annulare: Secondary | ICD-10-CM | POA: Insufficient documentation

## 2012-05-02 DIAGNOSIS — Z0000 Encounter for general adult medical examination without abnormal findings: Secondary | ICD-10-CM

## 2012-05-02 DIAGNOSIS — M949 Disorder of cartilage, unspecified: Secondary | ICD-10-CM

## 2012-05-02 LAB — HEPATIC FUNCTION PANEL
ALT: 16 U/L (ref 0–35)
AST: 29 U/L (ref 0–37)
Albumin: 4.4 g/dL (ref 3.5–5.2)
Alkaline Phosphatase: 77 U/L (ref 39–117)
Total Bilirubin: 0.5 mg/dL (ref 0.3–1.2)
Total Protein: 7.3 g/dL (ref 6.0–8.3)

## 2012-05-02 LAB — TSH: TSH: 0.81 u[IU]/mL (ref 0.35–5.50)

## 2012-05-02 LAB — BASIC METABOLIC PANEL
CO2: 28 mEq/L (ref 19–32)
Calcium: 9.2 mg/dL (ref 8.4–10.5)
Chloride: 101 mEq/L (ref 96–112)
Creatinine, Ser: 0.4 mg/dL (ref 0.4–1.2)
GFR: 156.95 mL/min (ref >60.00)
Glucose, Bld: 100 mg/dL — ABNORMAL HIGH (ref 70–99)
Potassium: 3.8 mEq/L (ref 3.5–5.1)
Sodium: 138 mEq/L (ref 135–145)

## 2012-05-02 LAB — CBC WITH DIFFERENTIAL/PLATELET
Basophils Relative: 0.7 % (ref 0.0–3.0)
Eosinophils Absolute: 0 10*3/uL (ref 0.0–0.7)
Eosinophils Relative: 0.6 % (ref 0.0–5.0)
Hemoglobin: 13.2 g/dL (ref 12.0–15.0)
Lymphocytes Relative: 21.5 % (ref 12.0–46.0)
Lymphs Abs: 1.4 10*3/uL (ref 0.7–4.0)
MCHC: 32.4 g/dL (ref 30.0–36.0)
MCV: 86.3 fl (ref 78.0–100.0)
Monocytes Absolute: 0.4 10*3/uL (ref 0.1–1.0)
Monocytes Relative: 6.7 % (ref 3.0–12.0)
Neutro Abs: 4.5 10*3/uL (ref 1.4–7.7)
Neutrophils Relative %: 70.5 % (ref 43.0–77.0)
Platelets: 311 10*3/uL (ref 150.0–400.0)
RBC: 4.71 Mil/uL (ref 3.87–5.11)
RDW: 14.3 % (ref 11.5–14.6)

## 2012-05-02 LAB — LIPID PANEL
Cholesterol: 189 mg/dL (ref 0–200)
HDL: 75.4 mg/dL (ref >39.00)
LDL Cholesterol: 102 mg/dL — ABNORMAL HIGH (ref 0–99)
Total CHOL/HDL Ratio: 3
VLDL: 12 mg/dL (ref 0.0–40.0)

## 2012-05-02 NOTE — Progress Notes (Signed)
Subjective:    Patient ID: CHEVELLE COULSON, female    DOB: 08/01/1946, 66 y.o.   MRN: [id number]  HPI  Mrs. Quito  is here for a physical; she denies significant acute issues .      Review of Systems Approximately 2 times a week she notices some incomplete stool inflammation requiring a return to the bathroom  30 minutes after BM. There is no significant retained stool. She denies abdominal pain, unexplained weight loss, melena, or rectal bleeding. Her last colonoscopy was 2004 and revealed diverticulosis.  Her gynecologist office  prescribed 50,000 international units of vitamin D 3 every other week. By voice report her vitamin D level was therapeutic in May of this year. She is due for a bone mineral density study. She did have surgical repair of a fracture of L wrist last year after catching foot on uneven surface . Remotely she fractured the same wrist after slipping in water ; surgery was not necessary .     Objective:   Physical Exam Gen.:  well-nourished in appearance. Alert, appropriate and cooperative throughout exam. Head: Normocephalic without obvious abnormalities Eyes: No corneal or conjunctival inflammation noted. Pupils equal round reactive to light and accommodation. Fundal exam is benign without hemorrhages, exudate, papilledema. Extraocular motion intact. Vision grossly normal with lenses. Ears: External  ear exam reveals no significant lesions or deformities. Canals clear .TMs normal. Hearing is grossly normal bilaterally. Nose: External nasal exam reveals no deformity or inflammation. Nasal mucosa are pink and moist. No lesions or exudates noted.   Mouth: Oral mucosa and oropharynx reveal no lesions or exudates. Teeth in good repair. Neck: No deformities, masses, or tenderness noted. Range of motion & Thyroid normal Lungs: Normal respiratory effort; chest expands symmetrically. Lungs are clear to auscultation without rales, wheezes, or increased work of breathing. Heart:  Normal rate and rhythm. Normal S1 and S2. No gallop, click, or rub. S4 w/o murmur. Abdomen: Bowel sounds normal; abdomen soft and nontender. No masses, organomegaly or hernias noted. Genitalia: Rock Nephew, NP                                                                                   Musculoskeletal/extremities: No deformity or scoliosis noted of  the thoracic or lumbar spine. No clubbing, cyanosis, edema, or deformity noted. Range of motion  normal .Tone & strength  normal.Joints normal. Nail health  good. Vascular: Carotid, radial artery, dorsalis pedis and  posterior tibial pulses are full and equal. No bruits present. Neurologic: Alert and oriented x3. Deep tendon reflexes symmetrical and normal.          Skin: scattered granuloma annulare lesions. Lymph: No cervical, axillary lymphadenopathy present. Psych: Mood and affect are normal. Normally interactive  Assessment & Plan:  #1 comprehensive physical exam; no acute findings #2 see Problem List with Assessments & Recommendations Plan: see Orders

## 2012-05-02 NOTE — Patient Instructions (Addendum)
Preventive Health Care: Exercise  30-45  minutes a day, 3-4 days a week. Walking is especially valuable in preventing Osteoporosis. To increase roughage in your diet eat a low-fat diet with lots of fruits and vegetables, up to 7-9 servings per day.  Metamucil or other such agents as needed for stool elimination. Complete the cleansing with a Tucks or Baby Wipe.  Health Care Power of Attorney & Living Will place you in charge of your health care  decisions. Verify these are  in place. If you activate My Chart; the results can be released to you as soon as they populate from the lab. If you choose not to use this program; the labs have to be reviewed, copied & mailed causing a delay in getting the results to you.

## 2012-08-08 ENCOUNTER — Ambulatory Visit (INDEPENDENT_AMBULATORY_CARE_PROVIDER_SITE_OTHER): Payer: BC Managed Care – PPO

## 2012-08-08 DIAGNOSIS — Z23 Encounter for immunization: Secondary | ICD-10-CM

## 2012-09-13 ENCOUNTER — Encounter: Payer: Self-pay | Admitting: Internal Medicine

## 2012-10-10 ENCOUNTER — Telehealth: Payer: Self-pay | Admitting: Internal Medicine

## 2012-10-10 DIAGNOSIS — Z1211 Encounter for screening for malignant neoplasm of colon: Secondary | ICD-10-CM

## 2012-10-10 NOTE — Telephone Encounter (Signed)
Referral placed, referral coordinator will contact patient with appointment date/time

## 2012-10-10 NOTE — Telephone Encounter (Signed)
pt needs referral for colonoscopy (due to insurance requirements) sent to [facility] DR Brodie--cb# [phone]

## 2012-10-11 ENCOUNTER — Ambulatory Visit (AMBULATORY_SURGERY_CENTER): Payer: BC Managed Care – PPO | Admitting: *Deleted

## 2012-10-11 ENCOUNTER — Encounter: Payer: Self-pay | Admitting: Internal Medicine

## 2012-10-11 VITALS — Ht 63.5 in | Wt 154.6 lb

## 2012-10-11 DIAGNOSIS — Z1211 Encounter for screening for malignant neoplasm of colon: Secondary | ICD-10-CM

## 2012-10-11 MED ORDER — MOVIPREP 100 G PO SOLR: ORAL | Status: DC

## 2012-10-25 ENCOUNTER — Ambulatory Visit (AMBULATORY_SURGERY_CENTER): Payer: BC Managed Care – PPO | Admitting: Internal Medicine

## 2012-10-25 ENCOUNTER — Encounter: Payer: Self-pay | Admitting: Internal Medicine

## 2012-10-25 VITALS — BP 126/79 | HR 76 | Temp 97.8°F | Resp 15 | Ht 63.0 in | Wt 154.0 lb

## 2012-10-25 DIAGNOSIS — Z1211 Encounter for screening for malignant neoplasm of colon: Secondary | ICD-10-CM

## 2012-10-25 MED ORDER — SODIUM CHLORIDE 0.9 % IV SOLN: 500.0000 mL | INTRAVENOUS | Status: DC

## 2012-10-25 NOTE — Patient Instructions (Addendum)
YOU HAD AN ENDOSCOPIC PROCEDURE TODAY AT THE [facility]: Refer to the procedure report that was given to you for any specific questions about what was found during the examination.  If the procedure report does not answer your questions, please call your gastroenterologist to clarify.  If you requested that your care partner not be given the details of your procedure findings, then the procedure report has been included in a sealed envelope for you to review at your convenience later.  YOU SHOULD EXPECT: Some feelings of bloating in the abdomen. Passage of more gas than usual.  Walking can help get rid of the air that was put into your GI tract during the procedure and reduce the bloating. If you had a lower endoscopy (such as a colonoscopy or flexible sigmoidoscopy) you may notice spotting of blood in your stool or on the toilet paper. If you underwent a bowel prep for your procedure, then you may not have a normal bowel movement for a few days.  DIET: Your first meal following the procedure should be a light meal and then it is ok to progress to your normal diet.  A half-sandwich or bowl of soup is an example of a good first meal.  Heavy or fried foods are harder to digest and may make you feel nauseous or bloated.  Likewise meals heavy in dairy and vegetables can cause extra gas to form and this can also increase the bloating.  Drink plenty of fluids but you should avoid alcoholic beverages for 24 hours.  ACTIVITY: Your care partner should take you home directly after the procedure.  You should plan to take it easy, moving slowly for the rest of the day.  You can resume normal activity the day after the procedure however you should NOT DRIVE or use heavy machinery for 24 hours (because of the sedation medicines used during the test).    SYMPTOMS TO REPORT IMMEDIATELY: A gastroenterologist can be reached at any hour.  During normal business hours, 8:30 AM to 5:00 PM Monday through Friday,  call [phone].  After hours and on weekends, please call the GI answering service at [phone] who will take a message and have the physician on call contact you.   Following lower endoscopy (colonoscopy or flexible sigmoidoscopy):  Excessive amounts of blood in the stool  Significant tenderness or worsening of abdominal pains  Swelling of the abdomen that is new, acute  Fever of 100F or higher  FOLLOW UP: If any biopsies were taken you will be contacted by phone or by letter within the next 1-3 weeks.  Call your gastroenterologist if you have not heard about the biopsies in 3 weeks.  Our staff will call the home number listed on your records the next business day following your procedure to check on you and address any questions or concerns that you may have at that time regarding the information given to you following your procedure. This is a courtesy call and so if there is no answer at the home number and we have not heard from you through the emergency physician on call, we will assume that you have returned to your regular daily activities without incident.  SIGNATURES/CONFIDENTIALITY: You and/or your care partner have signed paperwork which will be entered into your electronic medical record.  These signatures attest to the fact that that the information above on your After Visit Summary has been reviewed and is understood.  Full responsibility of the confidentiality of this   discharge information lies with you and/or your care-partner.  Diverticulosis-handout given  High Fiber Diet-handout given  Repeat colonoscopy in 10 years  

## 2012-10-25 NOTE — Op Note (Signed)
[facility] [address] [place], [address]   COLONOSCOPY PROCEDURE REPORT  PATIENT: Madison Wright, Madison Wright  MR#: [id number] BIRTHDATE: [birth year] , 66  yrs. old GENDER: Female ENDOSCOPIST: Hart Carwin, MD REFERRED BY:  10 year recall PROCEDURE DATE:  10/25/2012 PROCEDURE:   Colonoscopy, screening ASA CLASS:   Class II INDICATIONS:Average risk patient for colon cancer and last colon 2004. MEDICATIONS: MAC sedation, administered by CRNA and propofol (Diprivan) 200mg  IV  DESCRIPTION OF PROCEDURE:   After the risks and benefits and of the procedure were explained, informed consent was obtained.  A digital rectal exam revealed no abnormalities of the rectum.    The LB [id number]-[id number]  endoscope was introduced through the anus and advanced to the cecum, which was identified by both the appendix and ileocecal valve .  The quality of the prep was good, using MoviPrep .  The instrument was then slowly withdrawn as the colon was fully examined.     COLON FINDINGS: Mild diverticulosis was noted.     Retroflexed views revealed no abnormalities.     The scope was then withdrawn from the patient and the procedure completed.  COMPLICATIONS: There were no complications. ENDOSCOPIC IMPRESSION: Mild diverticulosis was noted in the sigmoid colon  RECOMMENDATIONS: High fiber diet   REPEAT EXAM: In 10 year(s)  for Colonoscopy.  cc:  _______________________________ eSignedHart Carwin, MD 10/25/2012 9:35 AM

## 2012-10-25 NOTE — Progress Notes (Signed)
Patient did not experience any of the following events: a burn prior to discharge; a fall within the facility; wrong site/side/patient/procedure/implant event; or a hospital transfer or hospital admission upon discharge from the facility. (G8907) Patient did not have preoperative order for IV antibiotic SSI prophylaxis. (G8918)  

## 2012-10-25 NOTE — Progress Notes (Signed)
Report to pacu rn vss, bbs=clear 

## 2012-10-26 ENCOUNTER — Telehealth: Payer: Self-pay | Admitting: *Deleted

## 2012-10-26 NOTE — Telephone Encounter (Signed)
Duplicate encounter, no additional phone call

## 2012-10-26 NOTE — Telephone Encounter (Signed)
  Follow up Call-  Call back number 10/25/2012  Post procedure Call Back phone  # [phone]  Permission to leave phone message Yes     Patient questions:  Do you have a fever, pain , or abdominal swelling? no Pain Score  0 *  Have you tolerated food without any problems? yes  Have you been able to return to your normal activities? yes  Do you have any questions about your discharge instructions: Diet   no Medications  no Follow up visit  no  Do you have questions or concerns about your Care? no  Actions: * If pain score is 4 or above: No action needed, pain <4.

## 2012-11-04 ENCOUNTER — Encounter: Payer: Self-pay | Admitting: Nurse Practitioner

## 2012-11-28 ENCOUNTER — Encounter: Payer: Self-pay | Admitting: Nurse Practitioner

## 2012-11-28 ENCOUNTER — Ambulatory Visit: Payer: Self-pay | Admitting: Nurse Practitioner

## 2012-11-28 DIAGNOSIS — Z01419 Encounter for gynecological examination (general) (routine) without abnormal findings: Secondary | ICD-10-CM

## 2012-12-14 ENCOUNTER — Encounter: Payer: Self-pay | Admitting: Nurse Practitioner

## 2012-12-14 ENCOUNTER — Ambulatory Visit (INDEPENDENT_AMBULATORY_CARE_PROVIDER_SITE_OTHER): Payer: BC Managed Care – PPO | Admitting: Nurse Practitioner

## 2012-12-14 VITALS — BP 112/66 | HR 68 | Ht 62.5 in | Wt 155.2 lb

## 2012-12-14 DIAGNOSIS — E559 Vitamin D deficiency, unspecified: Secondary | ICD-10-CM

## 2012-12-14 DIAGNOSIS — Z01419 Encounter for gynecological examination (general) (routine) without abnormal findings: Secondary | ICD-10-CM

## 2012-12-14 DIAGNOSIS — N952 Postmenopausal atrophic vaginitis: Secondary | ICD-10-CM

## 2012-12-14 DIAGNOSIS — Z0000 Encounter for general adult medical examination without abnormal findings: Secondary | ICD-10-CM

## 2012-12-14 LAB — POCT URINALYSIS DIPSTICK
Spec Grav, UA: 1.02
Urobilinogen, UA: NEGATIVE
pH, UA: 6.5

## 2012-12-14 MED ORDER — ESTRADIOL 0.1 MG/GM VA CREA: TOPICAL_CREAM | VAGINAL | Status: DC

## 2012-12-14 MED ORDER — ERGOCALCIFEROL 1.25 MG (50000 UT) PO CAPS: 50000.0000 [IU] | ORAL_CAPSULE | ORAL | Status: DC

## 2012-12-14 NOTE — Patient Instructions (Addendum)

## 2012-12-14 NOTE — Progress Notes (Signed)
67 y.o. G0P0000 Married Caucasian Fe here for annual exam.   Still working 30 hours.  Plans to retire in July 2014.  She used estrace vaginal cream and felt some better with bladder and vaginal dryness.  Out of med's now and needs refill.  Last used about 2 wk's ago.  No LMP recorded. Patient is postmenopausal.          Sexually active: no  The current method of family planning is post menopausal status.    Exercising: yes  Home exercise routine includes walking 3 hrs per week. Smoker:  no  Health Maintenance: Pap:  11/16/2011 normal with neg HR HPV MMG:  07/13/2012 normal Colonoscopy:  09/2008 BMD:   07/13/12 osteopenia, borderline osteoporosis. TDaP:  2010 Labs: Hgb-12.4   reports that she has never smoked. She has never used smokeless tobacco. She reports that she does not drink alcohol or use illicit drugs.  Past Medical History  Diagnosis Date  . Vitamin D deficiency   . Menopausal state 11/2000    Took HRT X 10 days only  in 05/2001 secondary to [facility] breast ca  . Post-menopausal atrophic vaginitis 07/2005    Started on Vagifem  . Granuloma annulare 2008    Dr. Nicholas Lose  . Diverticulosis of colon   . Bone spur 2005    right heel  . Osteoporosis, post-menopausal 07/13/2012    BMD. spine -2.1, R hip neck -2.2, total R -2.0    Past Surgical History  Procedure Laterality Date  . Colonoscopy  2004     diverticulosis, Dr Juanda Chance  . Wrist fracture surgery  2012    [facility]  . Skin cancer excision  2003    Basal skin    Current Outpatient Prescriptions  Medication Sig Dispense Refill  . Calcium Carbonate (SUPER CALCIUM) 1500 MG TABS Take by mouth every other day.        . ergocalciferol (VITAMIN D2) 50000 UNITS capsule Take 50,000 Units by mouth. Every other week      . Multiple Vitamin (MULTIVITAMIN) tablet Take 1 tablet by mouth daily.         No current facility-administered medications for this visit.    Family History  Problem Relation Age of Onset  . Arthritis  Mother   . Osteoporosis Mother     died after fall with fx. hips and PE  . Stroke Mother     post immobilization with vertebral fracture  . Colon cancer Paternal Aunt 40  . Heart disease Paternal Aunt     X 2; both had MI > 65  . Diabetes Paternal Aunt   . COPD Father   . Heart failure Paternal Grandmother   . Migraines Mother   . Cancer - Colon      nephew - chemo    ROS:  Pertinent items are noted in HPI.  Otherwise, a comprehensive ROS was negative.  Exam:   BP 112/66  Pulse 68  Ht 5' 2.5" (1.588 m)  Wt 155 lb 3.2 oz (70.398 kg)  BMI 27.92 kg/m2  Height: 5' 2.5" (158.8 cm)  Ht Readings from Last 3 Encounters:  12/14/12 5' 2.5" (1.588 m)  10/25/12 5\' 3"  (1.6 m)  10/11/12 5' 3.5" (1.613 m)    General appearance: alert, cooperative and appears stated age Head: Normocephalic, without obvious abnormality, atraumatic Neck: no adenopathy, supple, symmetrical, trachea midline and thyroid normal to inspection and palpation Lungs: clear to auscultation bilaterally Breasts: normal appearance, no masses or tenderness Heart: regular  rate and rhythm Abdomen: soft, non-tender; no masses,  no organomegaly Extremities: extremities normal, atraumatic, no cyanosis or edema Skin: Skin color, texture, turgor normal. No rashes or lesions Lymph nodes: Cervical, supraclavicular, and axillary nodes normal. No abnormal inguinal nodes palpated Neurologic: Grossly normal   Pelvic: External genitalia:  no lesions              Urethra:  normal appearing urethra with no masses, tenderness or lesions              Bartholin's and Skene's: normal                 Vagina: atrophic appearing vagina with pale color and no discharge, no lesions              Cervix: anteverted              Pap taken: no Bimanual Exam:  Uterus:  normal size, contour, position, consistency, mobility, non-tender              Adnexa: no mass, fullness, tenderness               Rectovaginal: Confirms               Anus:   normal sphincter tone, no lesions  A:  Well Woman with normal exam  Postmenopausal  Atrophic vaginitis  Granuloma Annulare  Vit D def  P:   Mammogram due 06/2013  pap smear as per guidelines  Refill Estrace vaginal cream  return annually or prn  An After Visit Summary was printed and given to the patient.

## 2012-12-15 MED ORDER — ESTRADIOL 0.1 MG/GM VA CREA: TOPICAL_CREAM | VAGINAL | Status: DC

## 2012-12-17 NOTE — Progress Notes (Signed)
Encounter reviewed by Dr. Brook Silva.  

## 2012-12-19 ENCOUNTER — Telehealth: Payer: Self-pay | Admitting: *Deleted

## 2012-12-19 LAB — IPS PAP TEST WITH REFLEX TO HPV

## 2012-12-19 NOTE — Telephone Encounter (Signed)
Message copied by Osie Bond on Tue Dec 19, 2012  1:08 PM ------      Message from: Ria Comment R      Created: Tue Dec 19, 2012 12:40 PM       Pap 02 ------

## 2012-12-19 NOTE — Telephone Encounter (Signed)
Pt is aware of negative results and has an aex recall on 12/14/2013.

## 2013-06-12 ENCOUNTER — Encounter: Payer: Self-pay | Admitting: Nurse Practitioner

## 2013-06-12 ENCOUNTER — Ambulatory Visit (INDEPENDENT_AMBULATORY_CARE_PROVIDER_SITE_OTHER): Payer: MEDICARE | Admitting: Nurse Practitioner

## 2013-06-12 VITALS — BP 120/74 | HR 80 | Temp 98.0°F | Ht 62.5 in | Wt 150.0 lb

## 2013-06-12 DIAGNOSIS — N390 Urinary tract infection, site not specified: Secondary | ICD-10-CM

## 2013-06-12 DIAGNOSIS — R350 Frequency of micturition: Secondary | ICD-10-CM

## 2013-06-12 LAB — POCT URINALYSIS DIPSTICK
Bilirubin, UA: NEGATIVE
Blood, UA: NEGATIVE
Glucose, UA: NEGATIVE
Nitrite, UA: NEGATIVE
Urobilinogen, UA: NEGATIVE

## 2013-06-12 MED ORDER — CIPROFLOXACIN HCL 500 MG PO TABS: 500.0000 mg | ORAL_TABLET | Freq: Two times a day (BID) | ORAL | Status: DC

## 2013-06-12 NOTE — Progress Notes (Signed)
Subjective:     Patient ID: Madison Wright, female   DOB: 07-21-1946, 67 y.o.   MRN: [id number]   This 67 yo WM Fe with urinary urgency, frequency, nocturia, and dysuria for 3 days.  About 2 months ago had another UTI and treated with Cipro and did well.  She did not have a follow up TOC.  She has not been using her Estrace vaginal cream to the urethra for about a month.  Urinary Tract Infection  Associated symptoms include frequency and urgency. Pertinent negatives include no chills, flank pain, hematuria, nausea or vomiting.    Review of Systems  Constitutional: Negative for fever, chills and fatigue.  Respiratory: Negative.   Cardiovascular: Negative.   Gastrointestinal: Negative.  Negative for nausea, vomiting, abdominal pain and diarrhea.  Genitourinary: Positive for dysuria, urgency and frequency. Negative for hematuria, flank pain and difficulty urinating.       Denies vaginal bleeding, spotting, or discharge.  Musculoskeletal: Negative for back pain.  Neurological: Negative for dizziness, weakness and light-headedness.       Objective:   Physical Exam  Constitutional: She is oriented to person, place, and time. She appears well-developed and well-nourished. No distress.  Abdominal: Soft. She exhibits no distension and no mass. There is no tenderness. There is no rebound and no guarding.  No flank pain.  Neurological: She is alert and oriented to person, place, and time.  Psychiatric: She has a normal mood and affect. Her behavior is normal. Judgment and thought content normal.       Assessment:     R/O UTI    Plan:     Urine C & S and follow Restart Cipro 500 mg BID #14 She will also restart her Estrace Vaginal cram to the urethra

## 2013-06-13 ENCOUNTER — Ambulatory Visit (INDEPENDENT_AMBULATORY_CARE_PROVIDER_SITE_OTHER): Payer: BC Managed Care – PPO

## 2013-06-13 DIAGNOSIS — Z23 Encounter for immunization: Secondary | ICD-10-CM

## 2013-06-13 LAB — URINE CULTURE
Colony Count: NO GROWTH
Organism ID, Bacteria: NO GROWTH

## 2013-06-14 ENCOUNTER — Telehealth: Payer: Self-pay | Admitting: *Deleted

## 2013-06-14 NOTE — Telephone Encounter (Signed)
I have attempted to contact this patient by phone with the following results: left message to return my call on answering machine (home).  

## 2013-06-14 NOTE — Telephone Encounter (Signed)
Message copied by Luisa Dago on Thu Jun 14, 2013 11:00 AM ------      Message from: Ria Comment R      Created: Thu Jun 14, 2013  8:34 AM       Let patient know that urine culture was negative, but I still want her to complete 3 days of Cipro.  No need for TOC.  Have her to continue Estrace to urethra. ------

## 2013-06-15 NOTE — Telephone Encounter (Signed)
Notified of results

## 2013-06-15 NOTE — Progress Notes (Signed)
Encounter reviewed by Dr. Brook Silva.  

## 2013-09-10 ENCOUNTER — Telehealth: Payer: Self-pay

## 2013-09-10 NOTE — Telephone Encounter (Signed)
Medication and allergies:  Reviewed and updated  90 day supply/mail order: n/a Local pharmacy:  [facility] , [place], [place]   Immunizations due:  Pneumonia vaccine   A/P: No changes to personal, family history or past surgical hx PAP- Edman Circle, NP CCS- 10/2012-Brodie-Neg- Next 2024 MMG-07/2013-Neg Flu-05/2013 Tdap- 02/2008 Shingles- 03/03/12 per pt.    To Discuss with Provider: Requesting a urine specimen for possible UTI. Provider options after retirement.

## 2013-09-10 NOTE — Telephone Encounter (Signed)
Left message for call back Non identifiable  PAP--Patty Raquel Sarna NP MMG--07/2013--neg Flu Vaccine--05/2013 Tdap--02/2008 CCS--10/2012--Brodie--neg--next 2024

## 2013-09-12 ENCOUNTER — Ambulatory Visit (INDEPENDENT_AMBULATORY_CARE_PROVIDER_SITE_OTHER): Payer: Medicare Other | Admitting: Internal Medicine

## 2013-09-12 ENCOUNTER — Encounter: Payer: Self-pay | Admitting: Internal Medicine

## 2013-09-12 VITALS — BP 143/69 | HR 71 | Temp 97.8°F | Ht 64.5 in | Wt 146.8 lb

## 2013-09-12 DIAGNOSIS — M899 Disorder of bone, unspecified: Secondary | ICD-10-CM

## 2013-09-12 DIAGNOSIS — R300 Dysuria: Secondary | ICD-10-CM

## 2013-09-12 DIAGNOSIS — K5730 Diverticulosis of large intestine without perforation or abscess without bleeding: Secondary | ICD-10-CM

## 2013-09-12 DIAGNOSIS — Z0000 Encounter for general adult medical examination without abnormal findings: Secondary | ICD-10-CM

## 2013-09-12 DIAGNOSIS — Z23 Encounter for immunization: Secondary | ICD-10-CM

## 2013-09-12 DIAGNOSIS — E785 Hyperlipidemia, unspecified: Secondary | ICD-10-CM

## 2013-09-12 DIAGNOSIS — M949 Disorder of cartilage, unspecified: Secondary | ICD-10-CM

## 2013-09-12 LAB — CBC WITH DIFFERENTIAL/PLATELET
BASOS ABS: 0 10*3/uL (ref 0.0–0.1)
Basophils Relative: 0.7 % (ref 0.0–3.0)
EOS ABS: 0.1 10*3/uL (ref 0.0–0.7)
Eosinophils Relative: 1 % (ref 0.0–5.0)
HEMATOCRIT: 40.2 % (ref 36.0–46.0)
HEMOGLOBIN: 13.4 g/dL (ref 12.0–15.0)
LYMPHS ABS: 1.6 10*3/uL (ref 0.7–4.0)
LYMPHS PCT: 22.4 % (ref 12.0–46.0)
MCHC: 33.3 g/dL (ref 30.0–36.0)
MCV: 84.3 fl (ref 78.0–100.0)
Monocytes Absolute: 0.4 10*3/uL (ref 0.1–1.0)
Monocytes Relative: 6.3 % (ref 3.0–12.0)
Neutro Abs: 4.9 10*3/uL (ref 1.4–7.7)
Neutrophils Relative %: 69.6 % (ref 43.0–77.0)
Platelets: 310 10*3/uL (ref 150.0–400.0)
RBC: 4.77 Mil/uL (ref 3.87–5.11)
RDW: 14.6 % (ref 11.5–14.6)
WBC: 7 10*3/uL (ref 4.5–10.5)

## 2013-09-12 LAB — POCT URINALYSIS DIPSTICK
Bilirubin, UA: NEGATIVE
Glucose, UA: NEGATIVE
KETONES UA: NEGATIVE
Leukocytes, UA: NEGATIVE
Nitrite, UA: NEGATIVE
PROTEIN UA: NEGATIVE
Spec Grav, UA: <=1.005
Urobilinogen, UA: 0.2
pH, UA: 6

## 2013-09-12 LAB — HEPATIC FUNCTION PANEL
ALBUMIN: 4.1 g/dL (ref 3.5–5.2)
ALT: 17 U/L (ref 0–35)
AST: 30 U/L (ref 0–37)
Alkaline Phosphatase: 85 U/L (ref 39–117)
Bilirubin, Direct: 0 mg/dL (ref 0.0–0.3)
Total Bilirubin: 0.7 mg/dL (ref 0.3–1.2)
Total Protein: 7.4 g/dL (ref 6.0–8.3)

## 2013-09-12 LAB — BASIC METABOLIC PANEL
BUN: 9 mg/dL (ref 6–23)
CO2: 27 meq/L (ref 19–32)
Calcium: 9.1 mg/dL (ref 8.4–10.5)
Chloride: 102 mEq/L (ref 96–112)
Creatinine, Ser: 0.5 mg/dL (ref 0.4–1.2)
GFR: 125.51 mL/min (ref >60.00)
GLUCOSE: 88 mg/dL (ref 70–99)
Potassium: 3.5 mEq/L (ref 3.5–5.1)
SODIUM: 136 meq/L (ref 135–145)

## 2013-09-12 LAB — LDL CHOLESTEROL, DIRECT: LDL DIRECT: 128.2 mg/dL

## 2013-09-12 LAB — LIPID PANEL
CHOLESTEROL: 218 mg/dL — AB (ref 0–200)
HDL: 79.9 mg/dL (ref >39.00)
Total CHOL/HDL Ratio: 3
Triglycerides: 63 mg/dL (ref 0.0–149.0)
VLDL: 12.6 mg/dL (ref 0.0–40.0)

## 2013-09-12 LAB — TSH: TSH: 0.44 u[IU]/mL (ref 0.35–5.50)

## 2013-09-12 NOTE — Addendum Note (Signed)
Addended by: Harl Bowie on: 09/12/2013 09:33 AM   Modules accepted: Orders

## 2013-09-12 NOTE — Patient Instructions (Addendum)
Your next office appointment will be determined based upon review of your pending labs . Those instructions will be transmitted to you through My Chart  or by mail if you're not using this system.  Drink as much nondairy fluids as possible. Avoid spicy foods or alcohol as  these may aggravate the bladder. Do not take decongestants. Avoid narcotics if possible. Minimal Blood Pressure Goal= AVERAGE < 140/90;  Ideal is an AVERAGE < 135/85. This AVERAGE should be calculated from @ least 5-7 BP readings taken @ different times of day on different days of week. You should not respond to isolated BP readings , but rather the AVERAGE for that week .Please bring your  blood pressure cuff to office visits to verify that it is reliable.It  can also be checked against the blood pressure device at the pharmacy. Finger or wrist cuffs are not dependable; an arm cuff is.

## 2013-09-12 NOTE — Progress Notes (Signed)
Pre visit review using our clinic review tool, if applicable. No additional management support is needed unless otherwise documented below in the visit note. 

## 2013-09-12 NOTE — Progress Notes (Signed)
Subjective:    Patient ID: Madison Wright, female    DOB: [birth year], 68 y.o.   MRN: [id number]  HPI Medicare Wellness Visit: Psychosocial and medical history were reviewed as required by Medicare (history related to abuse, antisocial behavior , firearm risk). Social history: Caffeine:3 glasses tea/day  , Alcohol: no , Tobacco HUT:MLYYT Exercise:see below Personal safety/fall risk:no Limitations of activities of daily living:no Seatbelt/ smoke alarm use:yes Healthcare Power of Attorney/Living Will status: in place Ophthalmologic exam status:UTD Hearing evaluation status:not current Orientation: Oriented X 3 Memory and recall: good Math testing: good Depression/anxiety assessment: no Foreign travel history:never Immunization status for influenza/pneumonia/ shingles /tetanus:PNA today Transfusion history:no Preventive health care maintenance status: Colonoscopy/BMD/mammogram/Pap as per protocol/standard care:UTD Dental care:every 6 mos Chart reviewed and updated. Active issues reviewed and addressed as documented below.    Review of Systems   She has had some dysuria for approximately one week. She's also had some nausea. There is no associated fever, chills, sweats, pyuria, or hematuria. A heart healthy diet is followed; exercise encompasses  40-60 minutes 3  times per week as  Dance & strength without symptoms.  Family history is negative for premature coronary disease. Advanced cholesterol testing reveals  LDL goal is less than 160 ; ideally <130. There is medication compliance with the statin.  Low dose ASA not taken Specifically denied are  chest pain, palpitations, dyspnea, or claudication.  Significant abdominal symptoms, memory deficit, or myalgias not present.  She has significant osteopenia. Her bone density is up to date. The lowest T score was -2.2. She previously had vitamin D deficiency but this was corrected with high dose supplementation. Followup vitamin D level  was due in April at her gynecologist's office.     Objective:   Physical Exam Gen.: Healthy and well-nourished in appearance. Alert, appropriate and cooperative throughout exam.  Head: Normocephalic without obvious abnormalities Eyes: No corneal or conjunctival inflammation noted. Pupils equal round reactive to light and accommodation. Extraocular motion intact.  Ears: External  ear exam reveals no significant lesions or deformities. Canals clear .TMs normal. Hearing is grossly normal bilaterally. Nose: External nasal exam reveals no deformity or inflammation. Nasal mucosa are pink and moist. No lesions or exudates noted.   Mouth: Oral mucosa and oropharynx reveal no lesions or exudates. Teeth in good repair. Neck: No deformities, masses, or tenderness noted. Range of motion & Thyroid normal. Lungs: Normal respiratory effort; chest expands symmetrically. Lungs are clear to auscultation without rales, wheezes, or increased work of breathing. Heart: Normal rate and rhythm. Normal S1 and S2. No gallop, click, or rub. No murmur. Abdomen: Bowel sounds normal; abdomen soft and nontender. No masses, organomegaly or hernias noted. Genitalia: as per Gyn                                  Musculoskeletal/extremities: No deformity or scoliosis noted of  the thoracic or lumbar spine.  No clubbing, cyanosis, edema, or significant extremity  deformity noted. Range of motion normal .Tone & strength normal. Hand joints normal . Fingernail  health good. Able to lie down & sit up w/o help. Negative SLR bilaterally Vascular: Carotid, radial artery, dorsalis pedis and  posterior tibial pulses are full and equal. No bruits present. Neurologic: Alert and oriented x3. Deep tendon reflexes symmetrical and normal.      Skin: Intact without suspicious lesions or rashes. Lymph: No cervical, axillary lymphadenopathy present. Psych: Mood and  affect are normal. Normally interactive                                                                                         Assessment & Plan:  #1 Medicare Wellness Exam; criteria met ; data entered #2 Problem List/Diagnoses reviewed  #3 dysuria Plan:  Assessments made/ Orders entered

## 2013-09-15 LAB — URINE CULTURE

## 2013-09-18 ENCOUNTER — Encounter: Payer: Self-pay | Admitting: *Deleted

## 2013-09-18 ENCOUNTER — Telehealth: Payer: Self-pay | Admitting: *Deleted

## 2013-09-18 NOTE — Telephone Encounter (Signed)
Message copied by Harl Bowie on Tue Sep 18, 2013  9:33 AM ------      Message from: Hendricks Limes      Created: Sun Sep 16, 2013 10:23 AM       See C&S results ------

## 2013-09-18 NOTE — Telephone Encounter (Signed)
Spoke with the pt and informed her of recent C&S results and note.  Pt stated that she is better and the burning is not as bad.  Pt stated she will hold off on the ATB and see how she does.   If things get worse she will call us and let us know.  Mailed pt her recent lab results//AB/CMA

## 2013-10-18 ENCOUNTER — Encounter: Payer: Self-pay | Admitting: Internal Medicine

## 2013-12-18 ENCOUNTER — Ambulatory Visit: Payer: MEDICARE | Admitting: Nurse Practitioner

## 2014-01-01 ENCOUNTER — Encounter: Payer: Self-pay | Admitting: Nurse Practitioner

## 2014-01-01 ENCOUNTER — Ambulatory Visit (INDEPENDENT_AMBULATORY_CARE_PROVIDER_SITE_OTHER): Payer: MEDICARE | Admitting: Nurse Practitioner

## 2014-01-01 VITALS — BP 140/78 | HR 78 | Ht 63.5 in | Wt 149.0 lb

## 2014-01-01 DIAGNOSIS — N952 Postmenopausal atrophic vaginitis: Secondary | ICD-10-CM

## 2014-01-01 DIAGNOSIS — Z01419 Encounter for gynecological examination (general) (routine) without abnormal findings: Secondary | ICD-10-CM

## 2014-01-01 MED ORDER — ESTRADIOL 0.1 MG/GM VA CREA: TOPICAL_CREAM | VAGINAL | Status: DC

## 2014-01-01 NOTE — Patient Instructions (Addendum)

## 2014-01-01 NOTE — Progress Notes (Signed)
Patient ID: Madison Wright, female   DOB: [birth year], 68 y.o.   MRN: [id number] 68 y.o. G0P0 Married Caucasian Fe here for annual exam.  Last UTI 1 year ago. Does not use vaginal estrogen very often.  Not SA now.   No new health problems. In a [employer] dance class.   Her brother in law is still dealing with his cancer of the stomache and possible mets to his back.  Patient's last menstrual period was 05/23/2001.          Sexually active: no  The current method of family planning is abstinence and post menopausal status.    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  Also takes dance class once a week.. Smoker:  no  Health Maintenance: Pap:  11/16/2011 normal with neg HR HPV MMG:  07/25/13, negative Colonoscopy:  10/25/2012 mild diverticulosis, recheck in 10 years BMD:   07/13/12 osteopenia, borderline osteoporosis. TD:  03/06/08 Labs:  by PCP in [facility] 09/12/13   reports that she has never smoked. She has never used smokeless tobacco. She reports that she does not drink alcohol or use illicit drugs.  Past Medical History  Diagnosis Date  . Vitamin D deficiency   . Menopausal state 11/2000    Took HRT X 10 days only  in 05/2001 secondary to [facility] breast ca  . Post-menopausal atrophic vaginitis 07/2005    Started on Vagifem  . Granuloma annulare 2008    Dr Amy Martinique  . Diverticulosis of colon   . Osteopenia 07/13/2012    BMD. spine -2.1, R hip neck -2.2, total R -2.0    Past Surgical History  Procedure Laterality Date  . Colonoscopy  2004  & 2014    diverticulosis, Dr Olevia Perches  . Wrist fracture surgery  2012    [facility]  . Skin cancer excision  2003    Basal skin    Current Outpatient Prescriptions  Medication Sig Dispense Refill  . Calcium Carbonate-Vitamin D (CALCIUM 600/VITAMIN D PO) Take 1 tablet by mouth every other day. Vitamin D 500 mg      . estradiol  (ESTRACE) 0.1 MG/GM vaginal cream Use 1/2 g vaginally twice weekly  42.5 g  3  . Multiple Vitamin (MULTIVITAMIN) tablet Take 1 tablet by mouth daily.         No current facility-administered medications for this visit.    Family History  Problem Relation Age of Onset  . Arthritis Mother   . Osteoporosis Mother     died after fall with fx. hips and PE  . Stroke Mother     post immobilization with vertebral fracture  . Colon cancer Paternal Aunt 85  . Heart disease Paternal Aunt     X 2; both had MI > 71  . Diabetes Paternal Aunt   . COPD Father   . Heart failure Paternal Grandmother   . Migraines Mother   . Cancer - Colon      nephew - chemo    ROS:  Pertinent items are noted in HPI.  Otherwise, a comprehensive ROS was negative.  Exam:   BP 140/78  Pulse 78  Ht 5' 3.5" (1.613 m)  Wt 149 lb (67.586 kg)  BMI 25.98 kg/m2  LMP 05/23/2001 Height: 5' 3.5" (161.3 cm)  Ht Readings from Last 3 Encounters:  01/01/14 5' 3.5" (1.613 m)  09/12/13 5' 4.5" (1.638  m)  06/12/13 5' 2.5" (1.588 m)    General appearance: alert, cooperative and appears stated age Head: Normocephalic, without obvious abnormality, atraumatic Neck: no adenopathy, supple, symmetrical, trachea midline and thyroid normal to inspection and palpation Lungs: clear to auscultation bilaterally Breasts: normal appearance, no masses or tenderness Heart: regular rate and rhythm Abdomen: soft, non-tender; no masses,  no organomegaly Extremities: extremities normal, atraumatic, no cyanosis or edema Skin: Skin color, texture, turgor normal. No rashes or lesions Lymph nodes: Cervical, supraclavicular, and axillary nodes normal. No abnormal inguinal nodes palpated Neurologic: Grossly normal   Pelvic: External genitalia:  no lesions              Urethra:  normal appearing urethra with no masses, tenderness or lesions              Bartholin's and Skene's: normal                 Vagina: normal appearing vagina with  normal color and discharge, no lesions              Cervix: anteverted              Pap taken: no Bimanual Exam:  Uterus:  normal size, contour, position, consistency, mobility, non-tender              Adnexa: no mass, fullness, tenderness               Rectovaginal: Confirms               Anus:  normal sphincter tone, no lesions  A:  Well Woman with normal exam  Postmenopausal no HRT  Atrophic vaginitis - only occasional use of vaginal estrogen  P:   Reviewed health and wellness pertinent to exam  Pap smear not taken today  Mammogram is due 12/15  Refill Estrace vaginal cream for a year - 'Pea' size amount to urethra 1-2 week  Counseled on breast self exam, mammography screening, adequate intake of calcium and vitamin D, diet and exercise return annually or prn  An After Visit Summary was printed and given to the patient.

## 2014-01-03 ENCOUNTER — Telehealth: Payer: Self-pay | Admitting: Nurse Practitioner

## 2014-01-03 NOTE — Telephone Encounter (Signed)
Patient is calling regarding her medication. (looks like starla did not route the message this morning)

## 2014-01-03 NOTE — Telephone Encounter (Signed)
Patient calling re: Estrace .5 grams cost has gone up from $90.00 to $195.00. Patient wants to make sure dosage/strength has not changed  [place]

## 2014-01-04 NOTE — Telephone Encounter (Signed)
Patient calling back, frustrated that she has not received a return call. Went to pick up her estrogen cream and cost is $105 greater than lat time she picked it up (Nov 14). She is calling at pharmacist advise to see if we have increased dose. Advised Chong Sicilian has 1/2 gm twice weekly and patient states this is what it has been. Discussed that this is likely just a change in her insurance "coverage" amount and that this is a three month supply. She is calling [employer] and will check on coverage (again at pharmacist recommendation), instructed she can ask [employer] if she had better coverage with mail order pharmacy. Call PRN.

## 2014-01-06 NOTE — Progress Notes (Signed)
Encounter reviewed by Dr. Hephzibah Strehle Silva.  

## 2014-01-07 ENCOUNTER — Telehealth: Payer: Self-pay | Admitting: Nurse Practitioner

## 2014-01-07 NOTE — Telephone Encounter (Signed)
[place] from [employer] is calling to get a diagnosis code for Estrace, the date it was started, and the RX's tried that did not work. She said this is their second attempt to get the information.

## 2014-01-08 NOTE — Telephone Encounter (Signed)
Spoke with patient to advise that I have spoken with nurse, Alyse Low at [facility].   Dx code Atrophic vaginitis - Primary 627.3 given.  Advised on vaginal estrogen since 05/31/2007, has previously tried Vagifem with worsening symptoms.   Tier exception request for Estrace as it is tier 4.  Tier 3 medications are vagifem and Premarin.   Notified patient will wait for their response with approval/denial.

## 2014-01-08 NOTE — Telephone Encounter (Signed)
Patient calling to check on the status of this. She states it is very urgent this get done as soon as possible. Patient states the insurance company will close the request if the information is not received by tomorrow. Patient seemed anxious we did not get to this yet and requested it be put as a high priority.

## 2014-01-08 NOTE — Telephone Encounter (Signed)
Agree with plan 

## 2014-01-08 NOTE — Telephone Encounter (Signed)
Elmo Putt from [facility] is calling to get a diagnosis code for Estrace, the date it was started, and the RX's tried that did not work. She said this is their second attempt to get the information.

## 2014-01-08 NOTE — Telephone Encounter (Signed)
Patient is calling again about the rx information being sent to insurance. Patient said that the insurance said that they faxed over the form on Friday afternoon

## 2014-01-11 NOTE — Telephone Encounter (Signed)
Patient returned call. She states that [facility] returned call authorized coverage.  Will close encounter.

## 2014-01-11 NOTE — Telephone Encounter (Signed)
Message left to return call to Jarquez Mestre at [phone].    

## 2014-03-07 ENCOUNTER — Encounter: Payer: Self-pay | Admitting: Internal Medicine

## 2014-03-07 ENCOUNTER — Ambulatory Visit (INDEPENDENT_AMBULATORY_CARE_PROVIDER_SITE_OTHER): Payer: Medicare Other | Admitting: Internal Medicine

## 2014-03-07 ENCOUNTER — Ambulatory Visit (INDEPENDENT_AMBULATORY_CARE_PROVIDER_SITE_OTHER)
Admission: RE | Admit: 2014-03-07 | Discharge: 2014-03-07 | Disposition: A | Discharge status: Home / Self Care | Payer: Medicare Other | Source: Ambulatory Visit | Attending: Internal Medicine | Admitting: Internal Medicine

## 2014-03-07 VITALS — BP 130/80 | HR 90 | Temp 98.1°F | Wt 148.2 lb

## 2014-03-07 DIAGNOSIS — M949 Disorder of cartilage, unspecified: Secondary | ICD-10-CM

## 2014-03-07 DIAGNOSIS — S6990XA Unspecified injury of unspecified wrist, hand and finger(s), initial encounter: Secondary | ICD-10-CM

## 2014-03-07 DIAGNOSIS — S6991XA Unspecified injury of right wrist, hand and finger(s), initial encounter: Secondary | ICD-10-CM

## 2014-03-07 DIAGNOSIS — M899 Disorder of bone, unspecified: Secondary | ICD-10-CM

## 2014-03-07 NOTE — Progress Notes (Signed)
Pre visit review using our clinic review tool, if applicable. No additional management support is needed unless otherwise documented below in the visit note. 

## 2014-03-07 NOTE — Patient Instructions (Signed)
Your next office appointment will be determined based upon review of your pending x-rays. Those instructions will be transmitted to you by mail.

## 2014-03-07 NOTE — Progress Notes (Signed)
   Subjective:    Patient ID: Madison Wright, female    DOB: 20-Jun-1946, 68 y.o.   MRN: [id number]  HPI   She was walking into an exercise class and tripped on a low  curb of 02/25/14. There was no cardiac or neurologic prodrome prior to the fall.  She did not hear or feel a pop or have immediate swelling.  She did not employ RICE following the fall.  She has developed pain along the lateral dorsal aspect of the right hand and over the fourth and fifth fingers. It's throbbing and intermittent. It is exacerbated by gripping maneuvers. It is also more prominent at night. It was not until 5 days after injury she noted increased swelling in this area. She did apply ice to treat the progressive swelling.  Significant past history includes osteopenia.     Review of Systems  Denied were any change in heart rhythm or rate prior to the event. There was no associated chest pain or shortness of breath .  Also specifically denied prior to the episode were headache, limb weakness, tingling, or numbness. No seizure activity noted.      Objective:   Physical Exam  General appearance:good health ;well nourished; no acute distress or increased work of breathing is present.  No  lymphadenopathy about the head, neck, or axilla noted.   Eyes: No conjunctival inflammation or lid edema is present. There is no scleral icterus.  Neck:  No deformities, thyromegaly, masses, or tenderness noted.   Supple with full range of motion without pain.   Heart:  Normal rate and regular rhythm. S1 and S2 normal without gallop, murmur, click, rub or other extra sounds.   Lungs:Chest clear to auscultation; no wheezes, rhonchi,rales ,or rubs present.No increased work of breathing.    Extremities:  No cyanosis, edema, or clubbing  noted . Some localized swelling but she is tender over the metacarpal bones below the fifth digit of the right hand. Able to make fist   Skin: Warm & dry w/o jaundice or tenting.Eschar over 3  abrasions R knee         Assessment & Plan:  #1 injury R hand in fall; R/O fracture.Pain meds declined See orders

## 2014-03-07 NOTE — Progress Notes (Signed)
   Subjective:    Patient ID: Madison Wright, female    DOB: [birth year], 68 y.o.   MRN: [id number]  HPI Pt is s/p Ailey from tripping on the curb on 02/25/14. Pt did not hear a pop nor was there immediate swelling. The pain is located on the lateral over the 4th and 5th metacarpals of the R hand. The pain is throbbing and intermittent. The pain is rated at an 8/10. The pain is exacerbated by gripping an object. It also occurs at night time. There is associated swelling that began 5 days after the injury. The swelling was the most severe last night, at which time the pt iced her hand. This morning the swelling is significantly better. The skin has been warm to touch. There is no associated bruising. The pt has taken ibuprofen for the pain.     Review of Systems  Constitutional: Negative for fever and chills.  Neurological: Negative for weakness and light-headedness.      Objective:   Physical Exam Tender to moderate palpation Decreased grip strength in R hand. Decreased dexterity of her R hand. Mild edema to R hand      Assessment & Plan:  #1 possible fracture; imaging is warranted

## 2014-06-13 ENCOUNTER — Ambulatory Visit (INDEPENDENT_AMBULATORY_CARE_PROVIDER_SITE_OTHER): Payer: Medicare Other

## 2014-06-13 DIAGNOSIS — Z23 Encounter for immunization: Secondary | ICD-10-CM

## 2014-08-04 ENCOUNTER — Encounter: Payer: Self-pay | Admitting: Internal Medicine

## 2014-08-06 ENCOUNTER — Telehealth: Payer: Self-pay | Admitting: Nurse Practitioner

## 2014-08-06 NOTE — Telephone Encounter (Signed)
Please let patient know that Dr. Linna Darner may contact her as well as both out names are on the BMD.  BMD done 07/26/14 shows a T Score at the spine of -2.0; left hip neck at -2.3 and right hip neck at -2.5.  This puts her in the lower Osteopenic range and upper Osteoporosis range for the right hip.  Comparison studies to 07/13/12 shows the spine and right hip are stable with a decrease of the left hip of 5.6 %.  Her FRAX score for major fracture in 10 years is 35.9% (goal is <20%).  The FRAX score for hip fracture in 10 years is 6.2% (goal is < 3 %).  The FRAX score has gone measurably higher than 2 years ago.  My concern for her with having a wrist fracture 2012 and [facility] of Osteoporosis, she is at much higher risk for a major fracture.  She needs to continue on calcium and Vit D but I fear it is time to have a discussion about next step of treatment.  She is too young to have such high risk of fracture.  I recommend that she either come here to discuss with one of our physicians or she needs to have this discussion with Dr. Linna Darner.

## 2014-08-07 NOTE — Telephone Encounter (Signed)
Thank you for the update.  I anticipate Dr. Clayborn Heron office will be contacting patient regarding this exam result.

## 2014-08-08 ENCOUNTER — Encounter: Payer: Self-pay | Admitting: Internal Medicine

## 2014-08-12 NOTE — Telephone Encounter (Signed)
Pt notified of results.  She is agreeable.  Appt scheduled with Dr. Sabra Heck to discuss results and options on 08/21/14  @930am .  Routing to provider for review.  Closing encounter.

## 2014-08-19 ENCOUNTER — Encounter: Payer: Self-pay | Admitting: Internal Medicine

## 2014-08-21 ENCOUNTER — Ambulatory Visit (INDEPENDENT_AMBULATORY_CARE_PROVIDER_SITE_OTHER): Payer: MEDICARE | Admitting: Obstetrics & Gynecology

## 2014-08-21 ENCOUNTER — Encounter: Payer: Self-pay | Admitting: Obstetrics & Gynecology

## 2014-08-21 VITALS — BP 140/82 | HR 68 | Resp 16 | Ht 63.5 in | Wt 145.2 lb

## 2014-08-21 DIAGNOSIS — M810 Age-related osteoporosis without current pathological fracture: Secondary | ICD-10-CM

## 2014-08-21 NOTE — Progress Notes (Signed)
Patient ID: Madison Wright, female   DOB: 06/07/1946, 68 y.o.   MRN: [id number]  Subjective:    27 yrs Married Caucasian G0P0  female here to discuss recent BMD obtained 07/26/14 showing osteoporosis with t score -2.5 in right hip.  Left hip was -2.3 and spine was -2.0.   FRAX was 6.9% for hip and 36% for major fracture risks.  Pt does take a MVI.  She is going to check about the amount of calcium and Vit D in it.  Normally, exercises 4 times a week (dancing or walking)  Osteoporosis Risk Factors  Nonmodifiable Personal Hx of fracture as an adult: arm 1990 with slipping while washing car, 03/2011 broke same arm [place] Hx of fracture in first-degree relative: mother, bilateral hips age 37-78 Caucasian race: yes Advanced age: no Female sex: yes Dementia: no Poor health/frailty: no  Potentially modifiable: Tobacco use: no Low body weight (<127 lbs): no Estrogen deficiency  early menopause (age <45) or bilateral ovariectomy: no, no HRT use  prolonged premenopausal amenorrhea (>1 yr): no Low calcium intake (lifelong): no Alcohol use more than 2 drinks per day: no Recurrent falls: no Inadequate physical activity: no  Current calcium and Vit D intake:  Review of Systems A comprehensive review of systems was negative.     Objective:   PHYSICAL EXAM BP 140/82 mmHg  Pulse 68  Resp 16  Ht 5' 3.5" (1.613 m)  Wt 145 lb 3.2 oz (65.862 kg)  BMI 25.31 kg/m2  LMP 05/23/2001 General appearance: alert and cooperative  Imaging Bone Density: Spine T Score: -2.0 spine, Hip T Score: -2.5 right hip   Done on 08/05/14 FRAX score:  10 year probability of hip fracture: 6.9% percent.                        10-year probability of major osteoporotic fractures combined is 36 percent.          TSH 1/21/5 normal.                                 Assessment:   Osteoporosis with T score -2.5 and significant fracture risk with FRAX scoring   Plan:   1.  Patient  counseled in adequate calcium and vitamin D exposure.  Calcium - 500 - 1000 mg elemental calcium/day in divided doses  Vitamin D - 800 IU/day  Told not to take at same time as bisphosphonate. 2.  Exercise recommended at least 30 minutes 3 times per week.  3.  Fall prevention discused 4.  VIT D, PTH with calcium today 5.  Pharmacologic therapy therapy below discussed including risks and benefits:   Bisphosphonates po (Fosamax, Actonel, Boniva)  Bisphosphonate IV (Reclast)  Evista  Prolia subcutaneous   Forteo subcutaneous  Calcitonin nasal spray  Estrogen/progesterone therapy Pt would like to consider Reclast.  Will call with new insurance information in next two months.  Will have new plan after beginning of 2016. 6.  Repeat bone density in 2 years.  ~25 minutes spent with patient >50% of time was in face to face discussion of above.

## 2014-08-22 LAB — PTH, INTACT AND CALCIUM
Calcium: 9.4 mg/dL (ref 8.4–10.5)
PTH: 22 pg/mL (ref 14–64)

## 2014-08-22 LAB — VITAMIN D 25 HYDROXY (VIT D DEFICIENCY, FRACTURES): Vit D, 25-Hydroxy: 38 ng/mL (ref 30–100)

## 2014-09-03 ENCOUNTER — Telehealth: Payer: Self-pay | Admitting: Obstetrics & Gynecology

## 2014-09-10 ENCOUNTER — Telehealth: Payer: Self-pay | Admitting: Emergency Medicine

## 2014-09-10 NOTE — Telephone Encounter (Signed)
Received copy of patient's new insurance card. [employer].  Called [phone].   Patient with Hx of Diverticulosis.  Dx M81.0 Senile osteoporosis.  T Score: -2.5  Drug Code: J3488, Reclast 5 mg in 100 ml normal saline. Procedure Code: 43276  Questions answered from representative. Will initiate prior authorization. States that clinical teams need to review and will send form if applicable.

## 2014-09-13 ENCOUNTER — Institutional Professional Consult (permissible substitution): Payer: Self-pay | Admitting: Obstetrics & Gynecology

## 2014-09-26 NOTE — Telephone Encounter (Signed)
Spoke with patient.  Advised received approval for Reclast.  Patient states she needs a reminder about what this for. Advised once a year treatment for osteoporosis and this infusion is done on an outpatient basis at [facility]. Patient has questions regarding cost for procedure itself. Advised patient that I cannot provide that information and to refer to [employer] for information about costs for outpatient procedures. Advised obtained coverage approval for medication only.  Patient states she will review her plan and try to call them. Patient feels she may need another appointment with Dr. Sabra Heck to discuss Reclast and other options, advised if she would like another appointment, that will be fine and to call back to schedule.

## 2014-09-26 NOTE — Telephone Encounter (Signed)
Patient calling stating her insurance company called her and said the authorization was approved. She wants to know what she should do next.

## 2014-09-27 NOTE — Telephone Encounter (Signed)
Agree with plan.  Encounter closed. 

## 2014-10-01 ENCOUNTER — Ambulatory Visit (INDEPENDENT_AMBULATORY_CARE_PROVIDER_SITE_OTHER): Payer: PPO | Admitting: Internal Medicine

## 2014-10-01 ENCOUNTER — Other Ambulatory Visit: Payer: Self-pay | Admitting: Internal Medicine

## 2014-10-01 ENCOUNTER — Encounter: Payer: Self-pay | Admitting: Internal Medicine

## 2014-10-01 ENCOUNTER — Other Ambulatory Visit (INDEPENDENT_AMBULATORY_CARE_PROVIDER_SITE_OTHER): Payer: PPO

## 2014-10-01 VITALS — BP 130/90 | HR 81 | Temp 98.2°F | Resp 16 | Ht 64.0 in | Wt 145.0 lb

## 2014-10-01 DIAGNOSIS — M810 Age-related osteoporosis without current pathological fracture: Secondary | ICD-10-CM

## 2014-10-01 DIAGNOSIS — E785 Hyperlipidemia, unspecified: Secondary | ICD-10-CM

## 2014-10-01 DIAGNOSIS — E559 Vitamin D deficiency, unspecified: Secondary | ICD-10-CM

## 2014-10-01 DIAGNOSIS — K5730 Diverticulosis of large intestine without perforation or abscess without bleeding: Secondary | ICD-10-CM

## 2014-10-01 LAB — BASIC METABOLIC PANEL
BUN: 9 mg/dL (ref 6–23)
CALCIUM: 9.5 mg/dL (ref 8.4–10.5)
CO2: 29 mEq/L (ref 19–32)
CREATININE: 0.57 mg/dL (ref 0.40–1.20)
Chloride: 101 mEq/L (ref 96–112)
GFR: 112.53 mL/min (ref >60.00)
GLUCOSE: 102 mg/dL — AB (ref 70–99)
Potassium: 4.1 mEq/L (ref 3.5–5.1)
Sodium: 137 mEq/L (ref 135–145)

## 2014-10-01 LAB — CBC WITH DIFFERENTIAL/PLATELET
BASOS PCT: 0.9 % (ref 0.0–3.0)
Basophils Absolute: 0.1 10*3/uL (ref 0.0–0.1)
EOS ABS: 0 10*3/uL (ref 0.0–0.7)
Eosinophils Relative: 0.3 % (ref 0.0–5.0)
HEMATOCRIT: 41.7 % (ref 36.0–46.0)
HEMOGLOBIN: 13.9 g/dL (ref 12.0–15.0)
LYMPHS PCT: 13.9 % (ref 12.0–46.0)
Lymphs Abs: 1.2 10*3/uL (ref 0.7–4.0)
MCHC: 33.4 g/dL (ref 30.0–36.0)
MCV: 84.4 fl (ref 78.0–100.0)
Monocytes Absolute: 0.4 10*3/uL (ref 0.1–1.0)
Monocytes Relative: 5 % (ref 3.0–12.0)
Neutro Abs: 6.6 10*3/uL (ref 1.4–7.7)
Neutrophils Relative %: 79.9 % — ABNORMAL HIGH (ref 43.0–77.0)
Platelets: 351 10*3/uL (ref 150.0–400.0)
RBC: 4.93 Mil/uL (ref 3.87–5.11)
RDW: 14.9 % (ref 11.5–15.5)
WBC: 8.3 10*3/uL (ref 4.0–10.5)

## 2014-10-01 LAB — HEPATIC FUNCTION PANEL
ALT: 13 U/L (ref 0–35)
AST: 23 U/L (ref 0–37)
Albumin: 4.3 g/dL (ref 3.5–5.2)
Alkaline Phosphatase: 83 U/L (ref 39–117)
BILIRUBIN DIRECT: 0.2 mg/dL (ref 0.0–0.3)
TOTAL PROTEIN: 7 g/dL (ref 6.0–8.3)
Total Bilirubin: 0.8 mg/dL (ref 0.2–1.2)

## 2014-10-01 LAB — TSH: TSH: 1.05 u[IU]/mL (ref 0.35–4.50)

## 2014-10-01 NOTE — Assessment & Plan Note (Signed)
Vitamin D level WNL 08/20/14 @ Gyn

## 2014-10-01 NOTE — Assessment & Plan Note (Addendum)
NMR Lipoprofile,TSH, LFTs

## 2014-10-01 NOTE — Patient Instructions (Signed)
  Your next office appointment will be determined based upon review of your pending labs. Those instructions will be transmitted to you  by mail. Critical values will be called.

## 2014-10-01 NOTE — Assessment & Plan Note (Signed)
CBC

## 2014-10-01 NOTE — Progress Notes (Signed)
Pre visit review using our clinic review tool, if applicable. No additional management support is needed unless otherwise documented below in the visit note. 

## 2014-10-01 NOTE — Progress Notes (Signed)
   Subjective:    Patient ID: Madison Wright, female    DOB: Apr 03, 1946, 69 y.o.   MRN: [id number]  HPI The patient is here to assess status of active health conditions.  PMH, FH, & Social History reviewed & updated.  She is on a heart healthy diet and exercises over 3 hours per week without cardio pulmonary symptoms  Her colonoscopy is up-to-date; she has no GI symptoms.  Her bone density in December has shown progression of bone loss with frank osteoporosis in the spine. She does have significant risk of major osteoporotic fracture. Her mother had fractured both hips as well as her vertebrae, the latter was preterminal.    Review of Systems   Chest pain, palpitations, tachycardia, exertional dyspnea, paroxysmal nocturnal dyspnea, claudication or edema are absent.  Unexplained weight loss, abdominal pain, significant dyspepsia, dysphagia, melena, rectal bleeding, or persistently small caliber stools are denied.     Objective:   Physical Exam  Gen.: Adequately nourished in appearance. Alert, appropriate and cooperative throughout exam.  Appears younger than stated age  Head: Normocephalic without obvious abnormalities  Eyes: No corneal or conjunctival inflammation noted. Pupils equal round reactive to light and accommodation. Extraocular motion intact.  Ears: External  ear exam reveals no significant lesions or deformities. Canals clear .TMs normal. Hearing is grossly normal bilaterally. Nose: External nasal exam reveals no deformity or inflammation. Nasal mucosa are pink and moist. No lesions or exudates noted. Slight septal deviation to R Mouth: Oral mucosa and oropharynx reveal no lesions or exudates. Teeth in good repair. Neck: No deformities, masses, or tenderness noted. Range of motion & thyroid normal. Lungs: Normal respiratory effort; chest expands symmetrically. Lungs are clear to auscultation without rales, wheezes, or increased work of breathing. Heart: Normal rate and  rhythm. Normal S1 and S2. No gallop, click, or rub. No murmur. Abdomen: Bowel sounds normal; abdomen soft and nontender. No masses, organomegaly or hernias noted.Aorta palpable ; no AAA Genitalia:  as per Gyn                                  Musculoskeletal/extremities: No deformity or scoliosis noted of  the thoracic or lumbar spine.  No clubbing, cyanosis, edema, or significant extremity  deformity noted.  Range of motion normal . Tone & strength normal. Hand joints normal Fingernail  health good. Crepitus of knees  Able to lie down & sit up w/o help.  Negative SLR bilaterally Vascular: Carotid, radial artery, dorsalis pedis and  posterior tibial pulses are full and equal. No bruits present. Neurologic: Alert and oriented x3. Deep tendon reflexes symmetrical and normal.  Gait normal     Skin: Intact without suspicious lesions or rashes. Lymph: No cervical, axillary lymphadenopathy present. Psych: Mood and affect are normal. Normally interactive                                                                                      Assessment & Plan:  See Current Assessment & Plan in Problem List under specific Diagnosis

## 2014-10-01 NOTE — Assessment & Plan Note (Signed)
Concur with Reclast

## 2014-10-02 ENCOUNTER — Telehealth: Payer: Self-pay

## 2014-10-02 ENCOUNTER — Other Ambulatory Visit (INDEPENDENT_AMBULATORY_CARE_PROVIDER_SITE_OTHER): Payer: PPO

## 2014-10-02 DIAGNOSIS — R739 Hyperglycemia, unspecified: Secondary | ICD-10-CM

## 2014-10-02 LAB — HEMOGLOBIN A1C: HEMOGLOBIN A1C: 5.9 % (ref 4.6–6.5)

## 2014-10-02 NOTE — Telephone Encounter (Signed)
-----   Message from Hendricks Limes, MD sent at 10/02/2014  9:27 AM EST ----- Please add A1c (R73.9)

## 2014-10-02 NOTE — Telephone Encounter (Signed)
Request for add on has been faxed to lab 

## 2014-10-03 LAB — NMR LIPOPROFILE WITH LIPIDS
CHOLESTEROL, TOTAL: 219 mg/dL — AB (ref 100–199)
HDL Particle Number: 40.1 umol/L (ref >=30.5)
HDL Size: 10.4 nm (ref >=9.2)
HDL-C: 98 mg/dL (ref >39)
LARGE HDL: 16.6 umol/L (ref >=4.8)
LARGE VLDL-P: 0.9 nmol/L (ref <=2.7)
LDL (calc): 112 mg/dL — ABNORMAL HIGH (ref 0–99)
LDL Particle Number: 915 nmol/L (ref <1000)
LDL Size: 21.1 nm (ref >=20.8)
LP-IR Score: <25 (ref <=45)
Small LDL Particle Number: <90 nmol/L (ref <=527)
Triglycerides: 47 mg/dL (ref 0–149)
VLDL Size: 42.8 nm (ref <=46.6)

## 2014-10-07 ENCOUNTER — Telehealth: Payer: Self-pay | Admitting: Nurse Practitioner

## 2014-10-07 NOTE — Telephone Encounter (Signed)
Spoke with patient. She is ready to schedule Reclast infusion. Patient declines consult and states that she spoke with PCP-Dr. Linna Darner and he said "Just get it done!" Patient researched side effects as well, understands potential flu like symptoms and body aches after infusion.   Labs drawn by Dr. Linna Darner on 10-01-14, order sent to [facility] outpatient infusion. Patient prefers early Monday or Wednesday appointment.

## 2014-10-07 NOTE — Telephone Encounter (Signed)
Message left to return call to Madison Wright at [phone].    

## 2014-10-07 NOTE — Telephone Encounter (Signed)
Patient calling to schedule Relast.

## 2014-10-08 NOTE — Telephone Encounter (Signed)
Outpatient infusion of Reclast for 10/14/14 at 0900 at the [facility].     Orders faxed with fax confirmation received to [facility].  Patient given instructions:   INSTRUCTIONS PRE-INFUSION for Reclast:   1. You may eat normally before receiving your infusion.   2. Drink two (2) glasses of fluids, such as water, within a few hours before receiving Reclast to help prevent kidney problems.   3. On the day of your infusion, DO NOT take any multivitamins.   4. On the day of your infusion, DO NOT take any other medications that are used to treat osteoporosis (like Fosamax, Boniva, Evista, Actonel).   5. Limit intake of NSAIDS (like Motrin) before and after the infusion. Tylenol only prior to infusion as needed.   6. You need to take an oral calcium supplement of 1200-1500 mg daily AND a Vitamin D supplement of at least 400IU daily.   Possible Side Effect: Patients made aware of the most commonly associated side effects of therapy. Patients may experience one or more side effects that could include: fever, flu-like symptoms, myalgia, arthralgia, and headache. Most of these side effects occur within the first 3 days following the dose of Reclast. They usually resolve within 3 days of onset but may last for up to 7 to 14 days. Patients should consult their physician if they have questions or if these symptoms persist. The incidence of these symptoms decreased markedly with subsequent doses of Reclast. Administration of acetaminophen following Reclast administration may reduce the incidence of these symptoms.   Patient verbalized understanding of instructions.  She will call back with any concerns.   Routing to provider for final review. Patient agreeable to disposition. Will close encounter

## 2014-10-09 ENCOUNTER — Other Ambulatory Visit: Payer: PPO

## 2014-10-11 ENCOUNTER — Other Ambulatory Visit (HOSPITAL_COMMUNITY): Payer: Self-pay | Admitting: *Deleted

## 2014-10-14 ENCOUNTER — Encounter (HOSPITAL_COMMUNITY)
Admission: RE | Admit: 2014-10-14 | Discharge: 2014-10-14 | Disposition: A | Discharge status: Home / Self Care | Payer: PPO | Source: Ambulatory Visit | Attending: Obstetrics & Gynecology | Admitting: Obstetrics & Gynecology

## 2014-10-14 DIAGNOSIS — M810 Age-related osteoporosis without current pathological fracture: Secondary | ICD-10-CM | POA: Insufficient documentation

## 2014-10-14 MED ORDER — ZOLEDRONIC ACID 5 MG/100ML IV SOLN
5.0000 mg | Freq: Once | INTRAVENOUS | Status: AC
  Administered 2014-10-14: 5 mg via INTRAVENOUS

## 2014-10-29 ENCOUNTER — Encounter (HOSPITAL_COMMUNITY): Payer: PPO

## 2015-04-23 ENCOUNTER — Telehealth: Payer: Self-pay

## 2015-04-23 NOTE — Telephone Encounter (Signed)
Call to schedule AWV for Madison Wright and [name] has not had one either; Scheduled AWV for her and spouse on 9/7 at 8am / will complete both

## 2015-04-30 ENCOUNTER — Ambulatory Visit (INDEPENDENT_AMBULATORY_CARE_PROVIDER_SITE_OTHER): Payer: PPO

## 2015-04-30 VITALS — BP 138/80 | Ht 63.0 in | Wt 148.0 lb

## 2015-04-30 DIAGNOSIS — Z0000 Encounter for general adult medical examination without abnormal findings: Secondary | ICD-10-CM | POA: Diagnosis not present

## 2015-04-30 DIAGNOSIS — Z23 Encounter for immunization: Secondary | ICD-10-CM | POA: Diagnosis not present

## 2015-04-30 NOTE — Progress Notes (Signed)
Subjective:   Madison Wright is a 69 y.o. female who presents for Medicare Annual (Subsequent) preventive examination.  Review of Systems:   HRA assessment completed during visit; Madison Wright is here for Annual Wellness Assessment The Wright was informed that this wellness visit is to identify risk and educate on how to reduce risk for increase disease through lifestyle changes.   ROS deferred to CPE exam with physician Results for BRAIDYN, PEACE (MRN [id number]) as of 04/29/2015 17:54  Ref. 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 center; [place]; [place]   SAFETY Broke wrist x 69 yo; walking on beach;  Safety reviewed for the home; including removal of clutter; clear paths through the home, eliminating clutter, railing as needed; bathroom safety; community safety; smoke detectors and firearms safety as well as sun protection;  Driving accidents; neg Sun protection encouraged Stressors; none to date  Medication review/ New meds/no  Fall assessment  No falls Gait assessment; normal; no difficulty  Mobilization and Functional losses in the last year. none   Urinary or fecal incontinence reviewed and denied   Counseling: Hepatitis c; declined; no risk identified  Colonoscopy; 10/25/2012/ repeat in 10 years EKG 05/02/2012 Dexa scan;  07/26/2014/ significant interval decrease in bone mineral density; left femur; progressive 10/14/2014 had zoledronic acid 5 mg (reclast) / given x 1 annually/ Tolerated reclast well;  Mammogram 07/26/2014/no evidence of malignancy/ will [facility] in  Dec Hearing: 4000 hz right ear and 2000 hz in left ear Ophthalmology exam; x 2 years ago; neg glaucoma; will schedule this year Goes to [employer] center;   Immunizations Due  Prevnar 13 given today Flu vaccine ;high does given today   Current Care Team reviewed and updated Dr. Raquel Sarna; GYN; Dr. Sabra Heck; osteoporosis  Cardiac Risk Factors include: advanced age (>33mn, >>69women);dyslipidemia     Objective:     Vitals: BP 138/80 mmHg  Ht _0  (1.6 m)  Wt 148 lb (67.132 kg)  BMI 26.22 kg/m2  LMP 05/23/2001  Tobacco History  Smoking status  . Never Smoker   Smokeless tobacco  . Never Used     Counseling given: Yes   Past Medical History  Diagnosis Date  . Vitamin D deficiency   . Menopausal state 11/2000    Took HRT X 10 days only  in 05/2001 [facility]  . Post-menopausal atrophic vaginitis 07/2005    Started on Vagifem  . Granuloma annulare 2008    Dr Amy JMartinique . Diverticulosis of colon   . Osteopenia 07/13/2012    BMD. spine -2.1, R hip neck -2.2, total R -2.0   Past Surgical History  Procedure Laterality Date  . Colonoscopy  2004  & 2014    diverticulosis, Dr BOlevia Perches . Wrist fracture surgery  2012    [facility]  . Skin cancer excision  2003    Basal skin   Family History  Problem Relation Age of Onset  . Arthritis Mother   . Osteoporosis Mother  died after fall with fx. hips and PE  . Stroke Mother     post immobilization with vertebral fracture  . Colon cancer Paternal Aunt 78  . Heart disease Paternal Aunt     X 2; both had MI > 7  . Diabetes Paternal Aunt   . COPD Father   . Heart failure Paternal Grandmother   . Migraines Mother   . Cancer - Colon      nephew - chemo   History  Sexual Activity  . Sexual Activity:  . Partners: Male  . Birth Control/ Protection: Post-menopausal    Outpatient Encounter Prescriptions as of 04/30/2015  Medication Sig  . estradiol (ESTRACE) 0.1 MG/GM vaginal cream Use 1/2 g vaginally  twice weekly  . Multiple Vitamin (MULTIVITAMIN) tablet Take 1 tablet by mouth daily.     No facility-administered encounter medications on file as of 04/30/2015.    Activities of Daily Living In your present state of health, do you have any difficulty performing the following activities: 04/30/2015 10/01/2014  Hearing? N N  Vision? N N  Difficulty concentrating or making decisions? N N  Walking or climbing stairs? N N  Dressing or bathing? N N  Doing errands, shopping? N N  Preparing Food and eating ? N -  Using the Toilet? N -  In the past six months, have you accidently leaked urine? N -  Do you have problems with loss of bowel control? N -  Managing your Medications? N -  Managing your Finances? N -  Housekeeping or managing your Housekeeping? N -    Wright Care Team: Hendricks Limes, MD as PCP - General    Assessment:    Assessment   Today Wright counseled on age appropriate routine health concerns for screening and prevention, each reviewed and up to date or declined. Immunizations reviewed and up to date; Rec; prevnar and high does flu shot today; . Labs deferred for CPE or medical fup by MD.  Risk factors for depression reviewed and negative. Hearing function and visual acuity are intact Mild hearing loss and recommend hearing screen when feasible. . ADLs screened and addressed as needed. Functional ability and level of safety reviewed and appropriate. Educated on memory loss and AD8 or MMSE completed; Education, counseling and referrals performed based on assessed risks today. Wright provided with a copy of personalized plan for preventive services and due dates  FALL PREVENTION Educated on prevention falls; Exercise, toning and strengthening; Balance exercises  The Wright is aware of osteo prevention and is currently doing tai chi; very active and works on feet x 3 days a week and works in the yard otherwise   [place] reviewed; no risk identified/ declined  hepatitis c screen  TOBACCO/ETOH or other DRUG use was negative   EXERCISE / DIET RECOMMENDATIONS The Wright agrees to continue to stay active; Goal is to transition from sweet tea to unsweetened tea to cut back on calories  Barriers to successful management: None noted; very engaging and motivated  Stress; (1-5) none noted     Exercise Activities and Dietary recommendations Current Exercise Habits:: Home exercise routine, Type of exercise: Other - see comments (exrercises on 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 Weight < 143 lb (64.864 kg)     Has lost weight in last 5 years;  Will plan to drink unsweetened tea and will start off 1/2 and 1/2  Drinking fluids; Eating vegetables      Fall Risk Fall Risk  10/01/2014 09/12/2013  Falls in the past year? No No   Depression Screen PHQ 2/9 Scores 04/30/2015 10/01/2014 09/12/2013  PHQ - 2 Score 0 0 0     Cognitive Testing No flowsheet data found.  Immunization History  Administered Date(s) Administered  . Influenza Split 08/08/2012  . Influenza, High Dose Seasonal PF 06/13/2013, 04/30/2015  . Influenza,inj,Quad PF,36+ Mos 06/13/2014  . Pneumococcal Conjugate-13 04/30/2015  . Pneumococcal Polysaccharide-23 09/12/2013  . Td 03/06/2008  . Zoster 03/03/2012   Screening Tests Health Maintenance  Topic Date Due  . Hepatitis C Screening  05-17-1946  . INFLUENZA VACCINE  03/24/2015  . MAMMOGRAM  07/26/2016  . TETANUS/TDAP  03/06/2018  . COLONOSCOPY  10/26/2022  . DEXA SCAN  Completed  . ZOSTAVAX  Completed  . PNA vac Low Risk Adult  Completed      Plan:     Rec'd prevnar 13 and high does flu today Will have mammogram in Dec of this year During the course of the visit the Wright was educated and counseled about the following appropriate screening and preventive services:   Vaccines to include Pneumoccal, Influenza, Hepatitis B, Td, Zostavax, HCV  Electrocardiogram/  04/2012  Cardiovascular Disease/ no issue voiced  Colorectal cancer screening; completed and due 2024  Bone density screening/ osteo and under treatment with Reclast  Diabetes screening/ completed  Glaucoma screening' To make apt as eye exam was 2 years ago  Mammography/PAP to be done in Dec and Pap deferred to GYN  Nutrition counseling ; reviewed and discussed  Wright Instructions (the written plan) was given to the Wright.   Wynetta Fines, RN  04/30/2015

## 2015-04-30 NOTE — Patient Instructions (Addendum)
Madison Wright , Thank you for taking time to come for your Medicare Wellness Visit. I appreciate your ongoing commitment to your health goals. Please review the following plan we discussed and let me know if I can assist you in the future.   These are the goals we discussed: Goals    . Weight < 143 lb (64.864 kg)     Has lost weight in last 5 years;  Will plan to drink unsweetened tea and will start off 1/2 and 1/2  Drinking fluids; Eating vegetables       This is a list of the screening recommended for you and due dates:  Health Maintenance  Topic Date Due  .  Hepatitis C: One time screening is recommended by Center for Disease Control  (CDC) for  adults born from [birth year] through 1963.   January 24, 1946  . Pneumonia vaccines (2 of 2 - PCV13) 09/12/2014  . Flu Shot  03/24/2015  . Mammogram  07/26/2016  . Tetanus Vaccine  03/06/2018  . Colon Cancer Screening  10/26/2022  . DEXA scan (bone density measurement)  Completed  . Shingles Vaccine  Completed     Health Maintenance Adopting a healthy lifestyle and getting preventive care can go a long way to promote health and wellness. Talk with your health care provider about what schedule of regular examinations is right for you. This is a good chance for you to check in with your provider about disease prevention and staying healthy. In between checkups, there are plenty of things you can do on your own. Experts have done a lot of research about which lifestyle changes and preventive measures are most likely to keep you healthy. Ask your health care provider for more information. WEIGHT AND DIET  Eat a healthy diet  Be sure to include plenty of vegetables, fruits, low-fat dairy products, and lean protein.  Do not eat a lot of foods high in solid fats, added sugars, or salt.  Get regular exercise. This is one of the most important things you can do for your health.  Most adults should exercise for at least 150 minutes each week. The exercise  should increase your heart rate and make you sweat (moderate-intensity exercise).  Most adults should also do strengthening exercises at least twice a week. This is in addition to the moderate-intensity exercise.  Maintain a healthy weight  Body mass index (BMI) is a measurement that can be used to identify possible weight problems. It estimates body fat based on height and weight. Your health care provider can help determine your BMI and help you achieve or maintain a healthy weight.  For females 24 years of age and older:   A BMI below 18.5 is considered underweight.  A BMI of 18.5 to 24.9 is normal.  A BMI of 25 to 29.9 is considered overweight.  A BMI of 30 and above is considered obese.  Watch levels of cholesterol and blood lipids  You should start having your blood tested for lipids and cholesterol at 67 years of age, then have this test every 5 years.  You may need to have your cholesterol levels checked more often if:  Your lipid or cholesterol levels are high.  You are older than 67 years of age.  You are at high risk for heart disease.  CANCER SCREENING   Lung Cancer  Lung cancer screening is recommended for adults 65-14 years old who are at high risk for lung cancer because of a history of smoking.  A yearly low-dose CT scan of the lungs is recommended for people who:  Currently smoke.  Have quit within the past 15 years.  Have at least a 30-pack-year history of smoking. A pack year is smoking an average of one pack of cigarettes a day for 1 year.  Yearly screening should continue until it has been 15 years since you quit.  Yearly screening should stop if you develop a health problem that would prevent you from having lung cancer treatment.  Breast Cancer  Practice breast self-awareness. This means understanding how your breasts normally appear and feel.  It also means doing regular breast self-exams. Let your health care provider know about any  changes, no matter how small.  If you are in your 20s or 30s, you should have a clinical breast exam (CBE) by a health care provider every 1-3 years as part of a regular health exam.  If you are 60 or older, have a CBE every year. Also consider having a breast X-ray (mammogram) every year.  If you have a family history of breast cancer, talk to your health care provider about genetic screening.  If you are at high risk for breast cancer, talk to your health care provider about having an MRI and a mammogram every year.  Breast cancer gene (BRCA) assessment is recommended for women who have family members with BRCA-related cancers. BRCA-related cancers include:  Breast.  Ovarian.  Tubal.  Peritoneal cancers.  Results of the assessment will determine the need for genetic counseling and BRCA1 and BRCA2 testing. Cervical Cancer Routine pelvic examinations to screen for cervical cancer are no longer recommended for nonpregnant women who are considered low risk for cancer of the pelvic organs (ovaries, uterus, and vagina) and who do not have symptoms. A pelvic examination may be necessary if you have symptoms including those associated with pelvic infections. Ask your health care provider if a screening pelvic exam is right for you.   The Pap test is the screening test for cervical cancer for women who are considered at risk.  If you had a hysterectomy for a problem that was not cancer or a condition that could lead to cancer, then you no longer need Pap tests.  If you are older than 65 years, and you have had normal Pap tests for the past 10 years, you no longer need to have Pap tests.  If you have had past treatment for cervical cancer or a condition that could lead to cancer, you need Pap tests and screening for cancer for at least 20 years after your treatment.  If you no longer get a Pap test, assess your risk factors if they change (such as having a new sexual partner). This can affect  whether you should start being screened again.  Some women have medical problems that increase their chance of getting cervical cancer. If this is the case for you, your health care provider may recommend more frequent screening and Pap tests.  The human papillomavirus (HPV) test is another test that may be used for cervical cancer screening. The HPV test looks for the virus that can cause cell changes in the cervix. The cells collected during the Pap test can be tested for HPV.  The HPV test can be used to screen women 68 years of age and older. Getting tested for HPV can extend the interval between normal Pap tests from three to five years.  An HPV test also should be used to screen women of any  age who have unclear Pap test results.  After 67 years of age, women should have HPV testing as often as Pap tests.  Colorectal Cancer  This type of cancer can be detected and often prevented.  Routine colorectal cancer screening usually begins at 67 years of age and continues through 67 years of age.  Your health care provider may recommend screening at an earlier age if you have risk factors for colon cancer.  Your health care provider may also recommend using home test kits to check for hidden blood in the stool.  A small camera at the end of a tube can be used to examine your colon directly (sigmoidoscopy or colonoscopy). This is done to check for the earliest forms of colorectal cancer.  Routine screening usually begins at age 8.  Direct examination of the colon should be repeated every 5-10 years through 67 years of age. However, you may need to be screened more often if early forms of precancerous polyps or small growths are found. Skin Cancer  Check your skin from head to toe regularly.  Tell your health care provider about any new moles or changes in moles, especially if there is a change in a mole's shape or color.  Also tell your health care provider if you have a mole that is  larger than the size of a pencil eraser.  Always use sunscreen. Apply sunscreen liberally and repeatedly throughout the day.  Protect yourself by wearing long sleeves, pants, a wide-brimmed hat, and sunglasses whenever you are outside. HEART DISEASE, DIABETES, AND HIGH BLOOD PRESSURE   Have your blood pressure checked at least every 1-2 years. High blood pressure causes heart disease and increases the risk of stroke.  If you are between 56 years and 39 years old, ask your health care provider if you should take aspirin to prevent strokes.  Have regular diabetes screenings. This involves taking a blood sample to check your fasting blood sugar level.  If you are at a normal weight and have a low risk for diabetes, have this test once every three years after 67 years of age.  If you are overweight and have a high risk for diabetes, consider being tested at a younger age or more often. PREVENTING INFECTION  Hepatitis B  If you have a higher risk for hepatitis B, you should be screened for this virus. You are considered at high risk for hepatitis B if:  You were born in a country where hepatitis B is common. Ask your health care provider which countries are considered high risk.  Your parents were born in a high-risk country, and you have not been immunized against hepatitis B (hepatitis B vaccine).  You have HIV or AIDS.  You use needles to inject street drugs.  You live with someone who has hepatitis B.  You have had sex with someone who has hepatitis B.  You get hemodialysis treatment.  You take certain medicines for conditions, including cancer, organ transplantation, and autoimmune conditions. Hepatitis C  Blood testing is recommended for:  Everyone born from [birth year] through 1963.  Anyone with known risk factors for hepatitis C. Sexually transmitted infections (STIs)  You should be screened for sexually transmitted infections (STIs) including gonorrhea and chlamydia  if:  You are sexually active and are younger than 67 years of age.  You are older than 67 years of age and your health care provider tells you that you are at risk for this type of infection.  Your sexual  activity has changed since you were last screened and you are at an increased risk for chlamydia or gonorrhea. Ask your health care provider if you are at risk.  If you do not have HIV, but are at risk, it may be recommended that you take a prescription medicine daily to prevent HIV infection. This is called pre-exposure prophylaxis (PrEP). You are considered at risk if:  You are sexually active and do not regularly use condoms or know the HIV status of your partner(s).  You take drugs by injection.  You are sexually active with a partner who has HIV. Talk with your health care provider about whether you are at high risk of being infected with HIV. If you choose to begin PrEP, you should first be tested for HIV. You should then be tested every 3 months for as long as you are taking PrEP.  PREGNANCY   If you are premenopausal and you may become pregnant, ask your health care provider about preconception counseling.  If you may become pregnant, take 400 to 800 micrograms (mcg) of folic acid every day.  If you want to prevent pregnancy, talk to your health care provider about birth control (contraception). OSTEOPOROSIS AND MENOPAUSE   Osteoporosis is a disease in which the bones lose minerals and strength with aging. This can result in serious bone fractures. Your risk for osteoporosis can be identified using a bone density scan.  If you are 23 years of age or older, or if you are at risk for osteoporosis and fractures, ask your health care provider if you should be screened.  Ask your health care provider whether you should take a calcium or vitamin D supplement to lower your risk for osteoporosis.  Menopause may have certain physical symptoms and risks.  Hormone replacement therapy  may reduce some of these symptoms and risks. Talk to your health care provider about whether hormone replacement therapy is right for you.  HOME CARE INSTRUCTIONS   Schedule regular health, dental, and eye exams.  Stay current with your immunizations.   Do not use any tobacco products including cigarettes, chewing tobacco, or electronic cigarettes.  If you are pregnant, do not drink alcohol.  If you are breastfeeding, limit how much and how often you drink alcohol.  Limit alcohol intake to no more than 1 drink per day for nonpregnant women. One drink equals 12 ounces of beer, 5 ounces of wine, or 1 ounces of hard liquor.  Do not use street drugs.  Do not share needles.  Ask your health care provider for help if you need support or information about quitting drugs.  Tell your health care provider if you often feel depressed.  Tell your health care provider if you have ever been abused or do not feel safe at home. Document Released: 02/22/2011 Document Revised: 12/24/2013 Document Reviewed: 07/11/2013 [facility] Patient Information 2015 [place], [place]. This information is not intended to replace advice given to you by your health care provider. Make sure you discuss any questions you have with your health care provider.

## 2015-04-30 NOTE — Progress Notes (Signed)
Medical screening examination/treatment/procedure(s) were performed by non-physician practitioner and as supervising physician I was immediately available for consultation/collaboration. I agree with above. Victorian Gunn, MD   

## 2015-05-28 ENCOUNTER — Ambulatory Visit: Payer: PPO | Admitting: Nurse Practitioner

## 2015-07-02 ENCOUNTER — Encounter: Payer: Self-pay | Admitting: Nurse Practitioner

## 2015-07-02 ENCOUNTER — Ambulatory Visit (INDEPENDENT_AMBULATORY_CARE_PROVIDER_SITE_OTHER): Payer: PPO | Admitting: Nurse Practitioner

## 2015-07-02 VITALS — BP 140/82 | HR 80 | Ht 63.25 in | Wt 141.0 lb

## 2015-07-02 DIAGNOSIS — Z0000 Encounter for general adult medical examination without abnormal findings: Secondary | ICD-10-CM | POA: Diagnosis not present

## 2015-07-02 DIAGNOSIS — Z01419 Encounter for gynecological examination (general) (routine) without abnormal findings: Secondary | ICD-10-CM

## 2015-07-02 DIAGNOSIS — N952 Postmenopausal atrophic vaginitis: Secondary | ICD-10-CM | POA: Diagnosis not present

## 2015-07-02 LAB — POCT URINALYSIS DIPSTICK
BILIRUBIN UA: NEGATIVE
GLUCOSE UA: NEGATIVE
Ketones, UA: NEGATIVE
Leukocytes, UA: NEGATIVE
Nitrite, UA: NEGATIVE
PH UA: 6
Protein, UA: NEGATIVE
RBC UA: NEGATIVE
UROBILINOGEN UA: NEGATIVE

## 2015-07-02 MED ORDER — ESTRADIOL 0.1 MG/GM VA CREA: TOPICAL_CREAM | VAGINAL | Status: DC

## 2015-07-02 NOTE — Patient Instructions (Addendum)

## 2015-07-02 NOTE — Progress Notes (Signed)
Patient ID: Madison Wright, female   DOB: 04-29-1946, 69 y.o.   MRN: [id number] 69 y.o. G0P0 Married  Caucasian Fe here for annual exam. She has done well this year.  Continues to work part time at [employer].  She is not SA.  Only using Estrace vaginal cream sporadically at the urethra prn.   Brother in Sports coach in [place] and expectant death any time.  She has been treated for her Osteoporosis in the hips with Reclast IV 09/2014 - wants to know if another injection is needed this year.    Patient's last menstrual period was 05/23/2001 (approximate).          Sexually active: No.  The current method of family planning is abstinence and post menopausal status.    Exercising: Yes.    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 She has never used smokeless tobacco. She reports that she does not drink alcohol or use illicit drugs.  Past Medical History  Diagnosis Date  . Vitamin D deficiency   . Menopausal state 11/2000    Took HRT X 10 days only  in 05/2001 secondary to [facility] breast ca  . Post-menopausal atrophic vaginitis 07/2005    Started on Vagifem  . Granuloma annulare 2008    Dr Amy Martinique  . Diverticulosis of colon   . Osteopenia 07/13/2012    BMD. spine -2.1, R hip neck -2.2, total R -2.0    Past Surgical History  Procedure Laterality Date  . Colonoscopy  2004  & 2014    diverticulosis, Dr Olevia Perches  . Wrist fracture surgery  2012    [facility]  . Skin cancer excision  2003    Basal skin    Current Outpatient Prescriptions  Medication Sig Dispense Refill  . estradiol (ESTRACE) 0.1 MG/GM vaginal cream Use 1/2 g vaginally  twice weekly 42.5 g 3  . Multiple Vitamin (MULTIVITAMIN) tablet Take 1 tablet by mouth daily.       No current facility-administered medications for this visit.    Family History  Problem Relation Age of Onset  . Arthritis Mother   . Osteoporosis Mother     died after fall with fx. hips and PE  . Stroke Mother     post immobilization with vertebral fracture  . Colon cancer Paternal Aunt 33  . Heart disease Paternal Aunt     X 2; both had MI > 72  . Diabetes Paternal Aunt   . COPD Father   . Heart failure Paternal Grandmother   . Migraines Mother   . Cancer - Colon      nephew - chemo    ROS:  Pertinent items are noted in HPI.  Otherwise, a comprehensive ROS was negative.  Exam:   BP 140/82 mmHg  Pulse 80  Ht 5' 3.25" (1.607 m)  Wt 141 lb (63.957 kg)  BMI 24.77 kg/m2  LMP 05/23/2001 (Approximate) Height: 5' 3.25" (160.7 cm) Ht Readings from Last 3 Encounters:  07/02/15 5' 3.25" (1.607 m)  04/30/15 '5\' 3"'  (1.6 m)  10/14/14 5' 3.5" (1.613 m)  General appearance: alert, cooperative and appears stated age Head: Normocephalic, without obvious abnormality, atraumatic Neck: no adenopathy, supple, symmetrical, trachea midline and thyroid normal to inspection and palpation Lungs: clear to auscultation bilaterally Breasts: normal appearance, no masses or tenderness Heart: regular rate and rhythm Abdomen: soft, non-tender; no masses,  no organomegaly Extremities: extremities normal, atraumatic, no cyanosis or edema Skin: Skin color, texture, turgor normal. No rashes or lesions Lymph nodes: Cervical, supraclavicular, and axillary nodes normal. No abnormal inguinal nodes palpated Neurologic: Grossly normal   Pelvic: External genitalia:  no lesions              Urethra:  normal appearing urethra with no masses, tenderness or lesions              Bartholin's and Skene's: normal                 Vagina: normal appearing vagina with normal color and discharge, no lesions               Cervix: anteverted              Pap taken: No. Bimanual Exam:  Uterus:  normal size, contour, position, consistency, mobility, non-tender              Adnexa: no mass, fullness, tenderness               Rectovaginal: Confirms               Anus:  normal sphincter tone, no lesions  Chaperone present: yes  A:  Well Woman with normal exam  Postmenopausal no HRT Atrophic vaginitis - only occasional use of vaginal estrogen  Borderline Osteoporosis - one dose of Reclast IV = 10/14/2014  P:   Reviewed health and wellness pertinent to exam  Pap smear as above  Mammogram is due 07/2015  Refill on Estrace vaginal cream for a year - only using sparingly  Counseled with risk of DVT, CVA, cancer, etc.  Will consult with Dr. Sabra Heck about next dose of Reclast.  Counseled on breast self exam, mammography screening, adequate intake of calcium and vitamin D, diet and exercise, Kegel's exercises return annually or prn  An After Visit Summary was printed and given to the patient.

## 2015-07-07 NOTE — Progress Notes (Signed)
Encounter reviewed by Dr. Baudelio Karnes Amundson C. Silva.  

## 2015-07-09 ENCOUNTER — Telehealth: Payer: Self-pay | Admitting: *Deleted

## 2015-07-09 NOTE — Telephone Encounter (Signed)
-----   Message from Kem Boroughs, [place] sent at 07/02/2015  6:25 PM EST ----- Please let pt. Know the recommendations from Dr. Sabra Heck ----- Message -----    From: Megan Salon, MD    Sent: 07/02/2015  11:26 AM      To: Kem Boroughs, FNP  It is appropriate to repeat the Reclast in Feb (2/17) and then do a repeat BMD in another year (2/18).   MSM ----- Message -----    From: Kem Boroughs, FNP    Sent: 07/02/2015  10:12 AM      To: Megan Salon, MD  Please look at chart and give pt a recommendation about a repeat dose of Reclast in 1 yr vs. 2 yrs.  Thanks,  Have Claiborne Billings or Colletta Maryland to give her a call with answer.

## 2015-07-09 NOTE — Telephone Encounter (Signed)
Pt notified of recommendations.  She is agreeable with this plan.  Routing to provider for final review.  Closing encounter.

## 2015-07-23 ENCOUNTER — Encounter: Payer: Self-pay | Admitting: Internal Medicine

## 2015-07-23 ENCOUNTER — Ambulatory Visit (INDEPENDENT_AMBULATORY_CARE_PROVIDER_SITE_OTHER): Payer: PPO | Admitting: Internal Medicine

## 2015-07-23 ENCOUNTER — Ambulatory Visit (INDEPENDENT_AMBULATORY_CARE_PROVIDER_SITE_OTHER)
Admission: RE | Admit: 2015-07-23 | Discharge: 2015-07-23 | Disposition: A | Discharge status: Home / Self Care | Payer: PPO | Source: Ambulatory Visit | Attending: Internal Medicine | Admitting: Internal Medicine

## 2015-07-23 VITALS — BP 126/82 | HR 80 | Temp 98.4°F | Ht 63.0 in | Wt 142.0 lb

## 2015-07-23 DIAGNOSIS — R059 Cough, unspecified: Secondary | ICD-10-CM | POA: Insufficient documentation

## 2015-07-23 DIAGNOSIS — R490 Dysphonia: Secondary | ICD-10-CM | POA: Diagnosis not present

## 2015-07-23 DIAGNOSIS — R05 Cough: Secondary | ICD-10-CM

## 2015-07-23 DIAGNOSIS — R062 Wheezing: Secondary | ICD-10-CM

## 2015-07-23 MED ORDER — LEVOFLOXACIN 250 MG PO TABS: 250.0000 mg | ORAL_TABLET | Freq: Every day | ORAL | Status: DC

## 2015-07-23 MED ORDER — HYDROCODONE-HOMATROPINE 5-1.5 MG/5ML PO SYRP: 5.0000 mL | ORAL_SOLUTION | Freq: Four times a day (QID) | ORAL | Status: DC | PRN

## 2015-07-23 NOTE — Patient Instructions (Signed)
Please take all new medication as prescribed - the antibiotic, and cough medicine  You can stop the [facility] for wheezing, unless you feel shortness of breath  Please continue all other medications as before, and refills have been done if requested.  Please have the pharmacy call with any other refills you may need.  Please keep your appointments with your specialists as you may have planned  Please go to the XRAY Department in the Basement (go straight as you get off the elevator) for the x-ray testing  You will be contacted by phone if any changes need to be made immediately.  Otherwise, you will receive a letter about your results with an explanation, but please check with MyChart first.  Please remember to sign up for MyChart if you have not done so, as this will be important to you in the future with finding out test results, communicating by private email, and scheduling acute appointments online when needed.

## 2015-07-23 NOTE — Assessment & Plan Note (Signed)
Recently has this by hx, but this aspect seems resolved, no need for steroid tx or further proair ,  to f/u any worsening symptoms or concerns

## 2015-07-23 NOTE — Progress Notes (Signed)
Subjective:    Patient ID: Madison Wright, female    DOB: 20-Oct-1945, 69 y.o.   MRN: [id number]  HPI  Here with c/o 2 days onset fever, hoarseness, non prod cough that kept her up all night, fatigue, and general weakness, but Pt denies chest pain, increased sob or doe, wheezing, orthopnea, PND, increased LE swelling, palpitations, dizziness or syncope.  Has had somewhat similar illness twice in the past 5 wks it seems, once going to UC with tx with amoxil course and proair hfa, which did not seem to help breathing.  Pt denies new neurological symptoms such as new headache, or facial or extremity weakness or numbness.  Pt denies polydipsia, polyuria.  Has had grandchild visit her several times over this period as well Past Medical History  Diagnosis Date  . Vitamin D deficiency   . Menopausal state 11/2000    Took HRT X 10 days only  in 05/2001 secondary to [facility] breast ca  . Post-menopausal atrophic vaginitis 07/2005    Started on Vagifem  . Granuloma annulare 2008    Dr Amy Martinique  . Diverticulosis of colon   . Osteopenia 07/13/2012    BMD. spine -2.1, R hip neck -2.2, total R -2.0   Past Surgical History  Procedure Laterality Date  . Colonoscopy  2004  & 2014    diverticulosis, Dr Olevia Perches  . Wrist fracture surgery  2012    [facility]  . Skin cancer excision  2003    Basal skin    reports that she has never smoked. She has never used smokeless tobacco. She reports that she does not drink alcohol or use illicit drugs. family history includes Arthritis in her mother; COPD in her father; Cancer - Colon in an other family member; Colon cancer (age of onset: 44) in her paternal aunt; Diabetes in her paternal aunt; Heart disease in her paternal aunt; Heart failure in her paternal grandmother; Migraines in her mother; Osteoporosis in her mother; Stroke in her mother. Allergies  Allergen Reactions  . Sulfonamide Derivatives     rash  . Codeine     Mood change ( "hyper")   Current Outpatient  Prescriptions on File Prior to Visit  Medication Sig Dispense Refill  . estradiol (ESTRACE) 0.1 MG/GM vaginal cream Use 1/2 g vaginally twice weekly 42.5 g 3  . Multiple Vitamin (MULTIVITAMIN) tablet Take 1 tablet by mouth daily.       No current facility-administered medications on file prior to visit.   Review of Systems  Constitutional: Negative for unusual diaphoresis or night sweats HENT: Negative for ringing in ear or discharge Eyes: Negative for double vision or worsening visual disturbance.  Respiratory: Negative for choking and stridor.   Gastrointestinal: Negative for vomiting or other signifcant bowel change Genitourinary: Negative for hematuria or change in urine volume.  Musculoskeletal: Negative for other MSK pain or swelling Skin: Negative for color change and worsening wound.  Neurological: Negative for tremors and numbness other than noted  Psychiatric/Behavioral: Negative for decreased concentration or agitation other than above       Objective:   Physical Exam BP 126/82 mmHg  Pulse 80  Temp(Src) 98.4 F (36.9 C) (Oral)  Ht 5\' 3"  (1.6 m)  Wt 142 lb (64.411 kg)  BMI 25.16 kg/m2  SpO2 97%  LMP 05/23/2001 (Approximate) VS noted, mild ill  Constitutional: Pt appears in no significant distress HENT: Head: NCAT.  Right Ear: External ear normal.  Left Ear: External ear normal.  Bilat tm's with mild erythema.  Max sinus areas non tender.  Pharynx with mild erythema, no exudate Eyes: . Pupils are equal, round, and reactive to light. Conjunctivae and EOM are normal Neck: Normal range of motion. Neck supple.  Cardiovascular: Normal rate and regular rhythm.   Pulmonary/Chest: Effort normal and breath sounds without rales or wheezing. but somewhat decresaed BS bilat Neurological: Pt is alert. Not confused , motor grossly intact Skin: Skin is warm. No rash, no LE edema Psychiatric: Pt behavior is normal. No agitation.     Assessment & Plan:

## 2015-07-23 NOTE — Assessment & Plan Note (Signed)
Recurring, ? Viral related to grandchild sick exposure vs atypical pna vs other; this time with new hoarseness as well, for cxr, empiric levaquin asd, cough med prn,  to f/u any worsening symptoms or concerns

## 2015-07-23 NOTE — Assessment & Plan Note (Signed)
Likely infectious related, but consider ENT if does not improve

## 2015-07-23 NOTE — Progress Notes (Signed)
Pre visit review using our clinic review tool, if applicable. No additional management support is needed unless otherwise documented below in the visit note. 

## 2015-07-24 ENCOUNTER — Telehealth: Payer: Self-pay | Admitting: Nurse Practitioner

## 2015-07-24 ENCOUNTER — Encounter: Payer: Self-pay | Admitting: Internal Medicine

## 2015-07-24 NOTE — Telephone Encounter (Addendum)
Patient said she spoke with stephanie in regards to results not too long ago. Patient said stephanie was going to send info in the mail with recommendations on when she needs to have another infusion done? Best # to reach: [phone]

## 2015-07-28 NOTE — Telephone Encounter (Signed)
Patient is calling again regarding info to mailed to her from [place]. Patient has not received anything in the mail. Please call patient st [phone]

## 2015-07-28 NOTE — Telephone Encounter (Signed)
Advised pt Madison Wright and I will put note with future bone density and Reclast information in mail today.  Pt appreciative and will call with any further questions once note is received.

## 2015-09-02 ENCOUNTER — Telehealth: Payer: Self-pay | Admitting: Family

## 2015-09-02 NOTE — Telephone Encounter (Signed)
Ok with me 

## 2015-09-02 NOTE — Telephone Encounter (Signed)
Pt was wanting to transfer from [facility] to Dr. Quay Burow Please advise

## 2015-09-03 NOTE — Telephone Encounter (Signed)
appt made

## 2015-09-03 NOTE — Telephone Encounter (Signed)
Ok with me 

## 2015-09-23 ENCOUNTER — Telehealth: Payer: Self-pay | Admitting: Family

## 2015-09-23 NOTE — Telephone Encounter (Signed)
Pt would like for you to call her, please.

## 2015-09-30 ENCOUNTER — Telehealth: Payer: Self-pay | Admitting: Family

## 2015-09-30 NOTE — Telephone Encounter (Signed)
Pt has been trying to get ahold of you. Can you please call her on her cell [phone] tomorrow.  She has a dental appt around 11 so she won't be around then.

## 2015-10-03 NOTE — Telephone Encounter (Signed)
Pt return Madison Wright call, attemp to transfer the massage below but pt want to speak to Madison Wright and want to know who she spoke with the GYN office. Please call her back today if possible [phone]

## 2015-10-03 NOTE — Telephone Encounter (Signed)
Call to Madison Wright; Reviewed the information below; Stated that the office did not confirm billing of [phone] but rather Health team would not pay for GYN; The patient stated she had GYN paid for x every year and had Health team advantage x 2 years. The patient will call [facility] GYN office and let me know if I can assist her further.

## 2015-10-03 NOTE — Telephone Encounter (Signed)
Call back to GYN office at [phone] and was told that billing is not available; Spoke to office representative who was familiar with the patient's charges.  This  patient verbalized that she had an AWV at [facility] and feels this is why she was charged for her  preventive exam at the GYN office. The GYN representative stated that this patient had BCBS last year and preventive exam was covered. She has "Healthteam advantage" this year and they do not cover a GYN preventive and that is why she was billed up front for 136.00. Her insurance changed and did not state that AWV was involved in this decision at all.   Called the patient back at both numbers; Left message for call back to clarify.

## 2015-10-08 NOTE — Telephone Encounter (Signed)
Pt is calling back and she states they say it is the way we are billing them. Can you please give her a call on her cell #.  Also, she claims the number you are giving her is not working

## 2015-10-10 NOTE — Telephone Encounter (Signed)
Call to fup on Ms. Leach issues and have referred to [employer].  She was billed [id number] in 2015; [place]  She was billed [phone] in 2016; Health team advantage

## 2015-10-21 ENCOUNTER — Ambulatory Visit (INDEPENDENT_AMBULATORY_CARE_PROVIDER_SITE_OTHER): Payer: PPO | Admitting: Internal Medicine

## 2015-10-21 ENCOUNTER — Encounter: Payer: Self-pay | Admitting: Internal Medicine

## 2015-10-21 VITALS — BP 150/78 | HR 85 | Temp 98.2°F | Resp 16 | Wt 146.0 lb

## 2015-10-21 DIAGNOSIS — R030 Elevated blood-pressure reading, without diagnosis of hypertension: Secondary | ICD-10-CM | POA: Insufficient documentation

## 2015-10-21 DIAGNOSIS — Z0000 Encounter for general adult medical examination without abnormal findings: Secondary | ICD-10-CM | POA: Diagnosis not present

## 2015-10-21 DIAGNOSIS — M810 Age-related osteoporosis without current pathological fracture: Secondary | ICD-10-CM

## 2015-10-21 DIAGNOSIS — E559 Vitamin D deficiency, unspecified: Secondary | ICD-10-CM | POA: Insufficient documentation

## 2015-10-21 NOTE — Assessment & Plan Note (Signed)
DEXA ordered by GYN Recall last given by GYN-first dose 2016, second dose planned for 2017 GYN monitor his vitamin D level Continue regular exercise Taking a multivitamin daily

## 2015-10-21 NOTE — Progress Notes (Signed)
Subjective:    Patient ID: Madison Wright, female    DOB: Jun 23, 1946, 70 y.o.   MRN: [id number]  HPI She is here for a physical exam.    In the past couple of months she has a burning and stinging pain in right groin.  The pain is not daily.  She is not sure if anything makes it worse.  She sees gyn and denies any vaginal pain.  She uses the vaginal cream once every couple of weeksl.  It does not seem to be related to activity or changes in position.  It does not bother her so much and at this point she thinks she just needs to monitor it more closely.   Osteoporosis - osteopenia:  She has osteoporosis in one hip and ostenopenia on the other hip.  She had reclast last year and plans on having it again this year.  Her gynecologist is managing her osteoporosis. Her gynecologist also monitors her vitamin D level.  She works part time at [employer]  three times a week.  She is very active and does exercise outside of her work.   Overall she feels good. She denies any changes in her history since last time she was here.    Medications and allergies reviewed with patient and updated if appropriate.  Patient Active Problem List   Diagnosis Date Noted  . Vitamin D deficiency 10/21/2015  . Diverticulosis of large intestine 01/07/2010  . Osteoporosis 01/07/2010  . Hyperlipidemia 03/06/2008  . GRANULOMA ANNULARE 03/06/2008    Current Outpatient Prescriptions on File Prior to Visit  Medication Sig Dispense Refill  . estradiol (ESTRACE) 0.1 MG/GM vaginal cream Use 1/2 g vaginally twice weekly 42.5 g 3  . Multiple Vitamin (MULTIVITAMIN) tablet Take 1 tablet by mouth daily.       No current facility-administered medications on file prior to visit.    Past Medical History  Diagnosis Date  . Vitamin D deficiency   . Menopausal state 11/2000    Took HRT X 10 days only  in 05/2001 secondary to [facility] breast ca  . Post-menopausal atrophic vaginitis 07/2005    Started on Vagifem  . Granuloma  annulare 2008    Dr Amy Martinique  . Diverticulosis of colon   . Osteopenia 07/13/2012    BMD. spine -2.1, R hip neck -2.2, total R -2.0    Past Surgical History  Procedure Laterality Date  . Colonoscopy  2004  & 2014    diverticulosis, Dr Olevia Perches  . Wrist fracture surgery  2012    [facility]  . Skin cancer excision  2003    Basal skin    Social History   Social History  . Marital Status: Married    Spouse Name: Mirabel Boehler  . Number of Children: 0  . Years of Education: N/A   Occupational History  . customer service    Social History Main Topics  . Smoking status: Never Smoker   . Smokeless tobacco: Never Used  . Alcohol Use: No  . Drug Use: No  . Sexual Activity:    Partners: Male    Birth Control/ Protection: Post-menopausal   Other Topics Concern  . None   Social History Narrative   exercises    Family History  Problem Relation Age of Onset  . Arthritis Mother   . Osteoporosis Mother     died after fall with fx. hips and PE  . Stroke Mother     post immobilization  with vertebral fracture  . Colon cancer Paternal Aunt 3  . Heart disease Paternal Aunt     X 2; both had MI > 94  . Diabetes Paternal Aunt   . COPD Father   . Heart failure Paternal Grandmother   . Migraines Mother   . Cancer - Colon      nephew - chemo    Review of Systems  Constitutional: Negative for fever, chills, appetite change and fatigue.  HENT: Negative for congestion, hearing loss and sore throat.   Eyes: Negative for visual disturbance.  Respiratory: Negative for cough, shortness of breath and wheezing.   Cardiovascular: Negative for chest pain, palpitations and leg swelling.  Gastrointestinal: Negative for nausea, abdominal pain, diarrhea, constipation and blood in stool.       No GERD  Genitourinary: Negative for dysuria and hematuria.  Musculoskeletal: Negative for back pain and arthralgias.  Skin: Negative for color change and rash.  Neurological: Negative for  dizziness, light-headedness and headaches.  Psychiatric/Behavioral: Negative for sleep disturbance and dysphoric mood. The patient is not nervous/anxious.        Objective:   Filed Vitals:   10/21/15 1500  BP: 150/78  Pulse: 85  Temp: 98.2 F (36.8 C)  Resp: 16   Filed Weights   10/21/15 1500  Weight: 146 lb (66.225 kg)   Body mass index is 25.87 kg/(m^2).   Physical Exam Constitutional: She appears well-developed and well-nourished. No distress.  HENT:  Head: Normocephalic and atraumatic.  Right Ear: External ear normal. Normal ear canal and TM Left Ear: External ear normal.  Normal ear canal and TM Mouth/Throat: Oropharynx is clear and moist.  Normal bilateral ear canals and tympanic membranes  Eyes: Conjunctivae and EOM are normal.  Neck: Neck supple. No tracheal deviation present. No thyromegaly present.  No carotid bruit  Cardiovascular: Normal rate, regular rhythm and normal heart sounds.   2/6 systolic murmur heard.  No edema. Pulmonary/Chest: Effort normal and breath sounds normal. No respiratory distress. She has no wheezes. She has no rales.  Breast: deferred to Gyn Abdominal: Soft. She exhibits no distension. There is no tenderness.  Lymphadenopathy: She has no cervical adenopathy.  Skin: Skin is warm and dry. She is not diaphoretic.  Psychiatric: She has a normal mood and affect. Her behavior is normal.         Assessment & Plan:   Physical exam: Screening blood work ordered Immunizations up to date Colonoscopy up to date Mammogram up to date Gyn - sees annually Dexa - up to date - done by gyn Eye exams up to date  EKG - not needed today, last EKG 2013 Exercise - very active and does some exercise Weight - WNL Skin  - no concerns Substance abuse -- no evidence of abuse  Follow up annually

## 2015-10-21 NOTE — Patient Instructions (Signed)
  We have reviewed your prior records including labs and tests today.  Test(s) ordered today. Your results will be released to [place] (or called to you) after review, usually within 72hours after test completion. If any changes need to be made, you will be notified at that same time.  All other Health Maintenance issues reviewed.   All recommended immunizations and age-appropriate screenings are up-to-date.  No immunizations administered today.   Medications reviewed and updated.  No changes recommended at this time.   Please followup annually for a physical exam.

## 2015-10-21 NOTE — Assessment & Plan Note (Signed)
Her blood pressure slightly elevated here today, but in the past appears always well controlled Advised her to monitor and return if it is not elevated

## 2015-10-21 NOTE — Telephone Encounter (Signed)
Rec'd a call from Mrs. Alonso today; States she is still angry regarding having GYN charge her upfront for 136.00 services and Health team will not pay due to rec'ing AWV here in Sept. States she feels she should not have been charged for 136.00 and was irate that health team told her AWV was 1200.00??  Will defer to manager to see The patient is coming in at 3pm today

## 2015-10-24 NOTE — Telephone Encounter (Signed)
AWV done 04/30/15, GYN annual was 07/02/15 ( previous GYN annual was 01/01/14 with [employer] not [employer]). Contacted [facility] Provider Concierge services for help with this patient issue.

## 2015-10-31 NOTE — Telephone Encounter (Signed)
[facility] concierge is working on this. I called Madison Wright and left a detailed voicemail that I have contacted [facility] on her behalf to get clarity on the billing issue between her AWV visit with [place] and her GYN visit.

## 2015-11-05 ENCOUNTER — Telehealth: Payer: Self-pay | Admitting: Nurse Practitioner

## 2015-11-05 NOTE — Telephone Encounter (Signed)
Patient calling to talk with the nurse concerning scheduling for reclast.

## 2015-11-06 ENCOUNTER — Telehealth: Payer: Self-pay | Admitting: Emergency Medicine

## 2015-11-06 DIAGNOSIS — Z01812 Encounter for preprocedural laboratory examination: Secondary | ICD-10-CM

## 2015-11-07 NOTE — Telephone Encounter (Signed)
Patient called 11/05/15 to request to schedule Reclast infusion.  Last infusion 10/14/14.   11/05/15 Prior authorization sent to [facility] for approval of Reclast.   Patient aware waiting insurance authorization.  Coming 11/19/15 for CMP. Patient off on Wednesdays. Can plan for Reclast any day at [facility] outpatient infusion center.

## 2015-11-19 ENCOUNTER — Other Ambulatory Visit (INDEPENDENT_AMBULATORY_CARE_PROVIDER_SITE_OTHER): Payer: PPO

## 2015-11-19 ENCOUNTER — Other Ambulatory Visit: Payer: PPO

## 2015-11-19 DIAGNOSIS — Z0000 Encounter for general adult medical examination without abnormal findings: Secondary | ICD-10-CM | POA: Diagnosis not present

## 2015-11-19 LAB — COMPREHENSIVE METABOLIC PANEL
ALT: 12 U/L (ref 0–35)
AST: 22 U/L (ref 0–37)
Albumin: 4.1 g/dL (ref 3.5–5.2)
Alkaline Phosphatase: 82 U/L (ref 39–117)
BILIRUBIN TOTAL: 0.6 mg/dL (ref 0.2–1.2)
BUN: 9 mg/dL (ref 6–23)
CO2: 30 meq/L (ref 19–32)
CREATININE: 0.57 mg/dL (ref 0.40–1.20)
Calcium: 9.4 mg/dL (ref 8.4–10.5)
Chloride: 100 mEq/L (ref 96–112)
GFR: 112.14 mL/min (ref >60.00)
GLUCOSE: 110 mg/dL — AB (ref 70–99)
Potassium: 3.9 mEq/L (ref 3.5–5.1)
SODIUM: 137 meq/L (ref 135–145)
Total Protein: 6.8 g/dL (ref 6.0–8.3)

## 2015-11-19 LAB — CBC WITH DIFFERENTIAL/PLATELET
BASOS PCT: 0.6 % (ref 0.0–3.0)
Basophils Absolute: 0 10*3/uL (ref 0.0–0.1)
EOS PCT: 1.3 % (ref 0.0–5.0)
Eosinophils Absolute: 0.1 10*3/uL (ref 0.0–0.7)
HEMATOCRIT: 40.6 % (ref 36.0–46.0)
HEMOGLOBIN: 13.3 g/dL (ref 12.0–15.0)
Lymphocytes Relative: 23.5 % (ref 12.0–46.0)
Lymphs Abs: 1.6 10*3/uL (ref 0.7–4.0)
MCHC: 32.8 g/dL (ref 30.0–36.0)
MCV: 84.6 fl (ref 78.0–100.0)
Monocytes Absolute: 0.4 10*3/uL (ref 0.1–1.0)
Monocytes Relative: 6.4 % (ref 3.0–12.0)
Neutro Abs: 4.7 10*3/uL (ref 1.4–7.7)
Neutrophils Relative %: 68.2 % (ref 43.0–77.0)
Platelets: 316 10*3/uL (ref 150.0–400.0)
RBC: 4.8 Mil/uL (ref 3.87–5.11)
RDW: 14.6 % (ref 11.5–15.5)
WBC: 6.8 10*3/uL (ref 4.0–10.5)

## 2015-11-19 LAB — LIPID PANEL
CHOL/HDL RATIO: 3
Cholesterol: 218 mg/dL — ABNORMAL HIGH (ref 0–200)
HDL: 79 mg/dL (ref >39.00)
LDL Cholesterol: 128 mg/dL — ABNORMAL HIGH (ref 0–99)
NonHDL: 139.17
Triglycerides: 58 mg/dL (ref 0.0–149.0)
VLDL: 11.6 mg/dL (ref 0.0–40.0)

## 2015-11-19 LAB — TSH: TSH: 1.42 u[IU]/mL (ref 0.35–4.50)

## 2015-11-19 LAB — HEMOGLOBIN A1C: Hgb A1c MFr Bld: 5.9 % (ref 4.6–6.5)

## 2015-11-19 NOTE — Telephone Encounter (Signed)
Canceled appointment for blood work for today. She had this done already this morning at [facility]

## 2015-11-20 ENCOUNTER — Encounter: Payer: Self-pay | Admitting: Internal Medicine

## 2015-11-20 DIAGNOSIS — R7303 Prediabetes: Secondary | ICD-10-CM

## 2015-11-20 HISTORY — DX: Prediabetes: R73.03

## 2015-11-20 NOTE — Telephone Encounter (Signed)
Laverne with [facility] left a message on our voicemail in regards to patient asked if you could give her a call back. [phone].

## 2015-11-20 NOTE — Telephone Encounter (Signed)
Faxed signed  Reclast order from Dr. Sabra Heck to to [facility] short [facility] infusion center to [phone] with lab results.

## 2015-11-20 NOTE — Telephone Encounter (Signed)
Called  Short stay, spoke with Erasmo Downer. She advised Laverene was calling to advise [place] that they do not call patient to schedule. The patient will have to call to schedule.   Scheduled first available early morning appointment 11/27/15 at 0800. Scheduled at [facility] stay, she will need to arrive for check in at 0745.   Message left to return call to [place] at [phone].  If this appointment will not work, can call [phone] to reschedule.

## 2015-11-21 NOTE — Telephone Encounter (Signed)
Patient returned call and she is notified of appointment. She is given phone number to [facility] Short stay if she has any concerns or questions or would like to change appointment.  Routing to provider for final review. Patient agreeable to disposition. Will close encounter.

## 2015-11-26 ENCOUNTER — Encounter: Payer: Self-pay | Admitting: Emergency Medicine

## 2015-11-26 ENCOUNTER — Other Ambulatory Visit (HOSPITAL_COMMUNITY): Payer: Self-pay | Admitting: *Deleted

## 2015-11-27 ENCOUNTER — Ambulatory Visit (HOSPITAL_COMMUNITY)
Admission: RE | Admit: 2015-11-27 | Discharge: 2015-11-27 | Disposition: A | Discharge status: Home / Self Care | Payer: PPO | Source: Ambulatory Visit | Attending: Physical Medicine and Rehabilitation | Admitting: Physical Medicine and Rehabilitation

## 2015-11-27 DIAGNOSIS — M810 Age-related osteoporosis without current pathological fracture: Secondary | ICD-10-CM | POA: Insufficient documentation

## 2015-11-27 MED ORDER — ZOLEDRONIC ACID 5 MG/100ML IV SOLN
INTRAVENOUS | Status: AC
  Administered 2015-11-27: 5 mg
  Filled 2015-11-27: qty 100

## 2016-01-12 ENCOUNTER — Telehealth: Payer: Self-pay | Admitting: Nurse Practitioner

## 2016-01-12 NOTE — Telephone Encounter (Signed)
Routing to Kem Boroughs, FNP as Juluis Rainier. Patient declines appointment for today.  Routing to provider for final review. Patient agreeable to disposition. Will close encounter.

## 2016-01-12 NOTE — Telephone Encounter (Signed)
Patient called and scheduled an appointment tomorrow with Edman Circle, FNP for internal pelvic "burning and stinging that has been going on awhile." She is not available to come in today.

## 2016-01-13 ENCOUNTER — Encounter: Payer: Self-pay | Admitting: Nurse Practitioner

## 2016-01-13 ENCOUNTER — Ambulatory Visit (INDEPENDENT_AMBULATORY_CARE_PROVIDER_SITE_OTHER): Payer: PPO | Admitting: Nurse Practitioner

## 2016-01-13 VITALS — BP 138/80 | HR 68 | Temp 97.8°F | Ht 63.0 in | Wt 146.0 lb

## 2016-01-13 DIAGNOSIS — R300 Dysuria: Secondary | ICD-10-CM | POA: Diagnosis not present

## 2016-01-13 DIAGNOSIS — B372 Candidiasis of skin and nail: Secondary | ICD-10-CM | POA: Diagnosis not present

## 2016-01-13 LAB — POCT URINALYSIS DIPSTICK
BILIRUBIN UA: NEGATIVE
Blood, UA: NEGATIVE
GLUCOSE UA: NEGATIVE
Ketones, UA: NEGATIVE
LEUKOCYTES UA: NEGATIVE
NITRITE UA: NEGATIVE
Protein, UA: NEGATIVE
Urobilinogen, UA: NEGATIVE
pH, UA: 6.5

## 2016-01-13 MED ORDER — FLUCONAZOLE 150 MG PO TABS: 150.0000 mg | ORAL_TABLET | Freq: Once | ORAL | Status: DC

## 2016-01-13 MED ORDER — NYSTATIN 100000 UNIT/GM EX POWD: CUTANEOUS | Status: DC

## 2016-01-13 NOTE — Patient Instructions (Signed)
Wear cotton and avoid moisture to the area.  Use Nystatin powder prn.

## 2016-01-13 NOTE — Progress Notes (Signed)
70 y.o. Married Caucasian female G0P0 here with complaint of itching, burning in the right groin area.  Symptoms started this past weekend.  Does not recall any change of soaps, detergents, under ware, etc.  She can feel the area gets warm and itching and burning all at same time.  Occurs more when she gets hot or sweating.  Denies vaginal symptoms and UTI symptoms other than slight dysuria if urine touches the labia.  Husband does not have any recent complaints or symptoms.    O:  Healthy female WDWN Affect: normal, orientation x 3  Exam: no distress Abdomen:  Soft and non tender Lymph node: no enlargement or tenderness Pelvic exam: External genital: normal female with a red macular linear rash from the right groin to the inguinal area.  There is some redness of the labia majora on the right side. BUS: negative Vagina: no discharge noted.    A: External yeast infection of the groin   P: Discussed findings of vaginitis and etiology. Discussed Aveeno or baking soda sitz bath for comfort. Avoid moist clothes or pads for extended period of time. If working out in gym clothes or swim suits for long periods of time change underwear.  Rx: Diflucan 150 mg X 2  Rx:  Nystatin powder BID prn  RV prn

## 2016-01-19 NOTE — Progress Notes (Signed)
Encounter reviewed by Dr. Kaisy Severino Amundson C. Silva.  

## 2016-03-30 DIAGNOSIS — Z85828 Personal history of other malignant neoplasm of skin: Secondary | ICD-10-CM | POA: Diagnosis not present

## 2016-03-30 DIAGNOSIS — L821 Other seborrheic keratosis: Secondary | ICD-10-CM | POA: Diagnosis not present

## 2016-03-30 DIAGNOSIS — D225 Melanocytic nevi of trunk: Secondary | ICD-10-CM | POA: Diagnosis not present

## 2016-03-30 DIAGNOSIS — D2261 Melanocytic nevi of right upper limb, including shoulder: Secondary | ICD-10-CM | POA: Diagnosis not present

## 2016-03-30 DIAGNOSIS — D1801 Hemangioma of skin and subcutaneous tissue: Secondary | ICD-10-CM | POA: Diagnosis not present

## 2016-06-02 ENCOUNTER — Encounter: Payer: Self-pay | Admitting: Internal Medicine

## 2016-06-02 ENCOUNTER — Ambulatory Visit (INDEPENDENT_AMBULATORY_CARE_PROVIDER_SITE_OTHER): Payer: PPO | Admitting: Internal Medicine

## 2016-06-02 ENCOUNTER — Other Ambulatory Visit (INDEPENDENT_AMBULATORY_CARE_PROVIDER_SITE_OTHER): Payer: PPO

## 2016-06-02 VITALS — BP 178/94 | HR 82 | Temp 98.1°F | Resp 16 | Ht 63.0 in | Wt 145.0 lb

## 2016-06-02 DIAGNOSIS — Z23 Encounter for immunization: Secondary | ICD-10-CM | POA: Diagnosis not present

## 2016-06-02 DIAGNOSIS — R350 Frequency of micturition: Secondary | ICD-10-CM

## 2016-06-02 DIAGNOSIS — R1031 Right lower quadrant pain: Secondary | ICD-10-CM | POA: Insufficient documentation

## 2016-06-02 DIAGNOSIS — R102 Pelvic and perineal pain: Secondary | ICD-10-CM | POA: Diagnosis not present

## 2016-06-02 LAB — URINALYSIS, ROUTINE W REFLEX MICROSCOPIC
BILIRUBIN URINE: NEGATIVE
Hgb urine dipstick: NEGATIVE
KETONES UR: NEGATIVE
LEUKOCYTES UA: NEGATIVE
Nitrite: NEGATIVE
PH: 6 (ref 5.0–8.0)
Total Protein, Urine: NEGATIVE
Urine Glucose: NEGATIVE
Urobilinogen, UA: 0.2 (ref 0.0–1.0)
WBC, UA: NONE SEEN (ref 0–?)

## 2016-06-02 NOTE — Progress Notes (Signed)
Subjective:    Patient ID: Madison Wright, female    DOB: 04/30/1946, 70 y.o.   MRN: [id number]  HPI She is here for an acute visit.   She has intermittent burning or stinging in the right groin.  She denies any redness or rash.  If she is busy more it may hurt more, but she is not sure.  The other day she thought she felt a lump there.  She does not recall it being worse with any other activities.  It has gotten worse since it started 3-4 months ago.  She wonders about a possible UTI.  She has some frequency but denies other urinary symptoms.  She wonders about hip arthritis, but denies pain with walking.   Medications and allergies reviewed with patient and updated if appropriate.  Patient Active Problem List   Diagnosis Date Noted  . Prediabetes 11/20/2015  . Vitamin D deficiency 10/21/2015  . Elevated blood pressure (not hypertension) 10/21/2015  . Diverticulosis of large intestine 01/07/2010  . Osteoporosis 01/07/2010  . Hyperlipidemia 03/06/2008  . GRANULOMA ANNULARE 03/06/2008    Current Outpatient Prescriptions on File Prior to Visit  Medication Sig Dispense Refill  . estradiol (ESTRACE) 0.1 MG/GM vaginal cream Use 1/2 g vaginally twice weekly 42.5 g 3  . fluconazole (DIFLUCAN) 150 MG tablet Take 1 tablet (150 mg total) by mouth once. Take one tablet.  Repeat in 48 hours if symptoms are not completely resolved. 2 tablet 0  . Multiple Vitamin (MULTIVITAMIN) tablet Take 1 tablet by mouth daily.      Marland Kitchen nystatin (NYSTATIN) powder Use topically BID 15 g 1   No current facility-administered medications on file prior to visit.     Past Medical History:  Diagnosis Date  . Diverticulosis of colon   . Granuloma annulare 2008   Dr Amy Martinique  . Menopausal state 11/2000   Took HRT X 10 days only  in 05/2001 secondary to [facility] breast ca  . Osteopenia 07/13/2012   BMD. spine -2.1, R hip neck -2.2, total R -2.0  . Post-menopausal atrophic vaginitis 07/2005   Started on Vagifem  .  Vitamin D deficiency     Past Surgical History:  Procedure Laterality Date  . COLONOSCOPY  2004  & 2014   diverticulosis, Dr Olevia Perches  . SKIN CANCER EXCISION  2003   Basal skin  . WRIST FRACTURE SURGERY  2012   [facility]    Social History   Social History  . Marital status: Married    Spouse name: Carnetta Deveau  . Number of children: 0  . Years of education: N/A   Occupational History  . customer service [place] History Main Topics  . Smoking status: Never Smoker  . Smokeless tobacco: Never Used  . Alcohol use No  . Drug use: No  . Sexual activity: Not Currently    Partners: Male    Birth control/ protection: Post-menopausal   Other Topics Concern  . Not on file   Social History Narrative   exercises    Family History  Problem Relation Age of Onset  . Arthritis Mother   . Osteoporosis Mother     died after fall with fx. hips and PE  . Stroke Mother     post immobilization with vertebral fracture  . Colon cancer Paternal Aunt 27  . Heart disease Paternal Aunt     X 2; both had MI > 4  . Diabetes Paternal Aunt   .  COPD Father   . Heart failure Paternal Grandmother   . Migraines Mother   . Cancer - Colon      nephew - chemo    Review of Systems  Constitutional: Negative for fever.  Gastrointestinal: Negative for abdominal pain, constipation and diarrhea.  Genitourinary: Positive for frequency (? increase). Negative for difficulty urinating, dysuria, hematuria, vaginal discharge and vaginal pain.  Musculoskeletal: Negative for arthralgias.  Skin: Negative for color change and rash.  Neurological: Positive for speech difficulty.       Objective:   Vitals:   06/02/16 1019  BP: (!) 178/94  Pulse: 82  Resp: 16  Temp: 98.1 F (36.7 C)   Filed Weights   06/02/16 1019  Weight: 145 lb (65.8 kg)   Body mass index is 25.69 kg/m.   Physical Exam  Constitutional: She appears well-developed and well-nourished. No distress.    Abdominal: Soft. She exhibits no distension and no mass. There is no tenderness. There is no rebound and no guarding.  No tenderness in right suprapubic or obvious lump in suprapubic region, no right groin pain  Skin: She is not diaphoretic.          Assessment & Plan:   See Problem List for Assessment and Plan of chronic medical problems.

## 2016-06-02 NOTE — Patient Instructions (Signed)
Test(s) ordered today. Your results will be released to [place] (or called to you) after review, usually within 72hours after test completion. If any changes need to be made, you will be notified at that same time.   You may have a hernia. If your symptoms worsen you should see a surgeon and consider surgery.    Inguinal Hernia, Adult Muscles help keep everything in the body in its proper place. But if a weak spot in the muscles develops, something can poke through. That is called a hernia. When this happens in the lower part of the belly (abdomen), it is called an inguinal hernia. (It takes its name from a part of the body in this region called the inguinal canal.) A weak spot in the wall of muscles lets some fat or part of the small intestine bulge through. An inguinal hernia can develop at any age. Men get them more often than women. CAUSES  In adults, an inguinal hernia develops over time.  It can be triggered by:  Suddenly straining the muscles of the lower abdomen.  Lifting heavy objects.  Straining to have a bowel movement. Difficult bowel movements (constipation) can lead to this.  Constant coughing. This may be caused by smoking or lung disease.  Being overweight.  Being pregnant.  Working at a job that requires long periods of standing or heavy lifting.  Having had an inguinal hernia before. One type can be an emergency situation. It is called a strangulated inguinal hernia. It develops if part of the small intestine slips through the weak spot and cannot get back into the abdomen. The blood supply can be cut off. If that happens, part of the intestine may die. This situation requires emergency surgery. SYMPTOMS  Often, a small inguinal hernia has no symptoms. It is found when a healthcare provider does a physical exam. Larger hernias usually have symptoms.   In adults, symptoms may include:  A lump in the groin. This is easier to see when the person is standing. It  might disappear when lying down.  In men, a lump in the scrotum.  Pain or burning in the groin. This occurs especially when lifting, straining or coughing.  A dull ache or feeling of pressure in the groin.  Signs of a strangulated hernia can include:  A bulge in the groin that becomes very painful and tender to the touch.  A bulge that turns red or purple.  Fever, nausea and vomiting.  Inability to have a bowel movement or to pass gas. DIAGNOSIS  To decide if you have an inguinal hernia, a healthcare provider will probably do a physical examination.  This will include asking questions about any symptoms you have noticed.  The healthcare provider might feel the groin area and ask you to cough. If an inguinal hernia is felt, the healthcare provider may try to slide it back into the abdomen.  Usually no other tests are needed. TREATMENT  Treatments can vary. The size of the hernia makes a difference. Options include:  Watchful waiting. This is often suggested if the hernia is small and you have had no symptoms.  No medical procedure will be done unless symptoms develop.  You will need to watch closely for symptoms. If any occur, contact your healthcare provider right away.  Surgery. This is used if the hernia is larger or you have symptoms.  Open surgery. This is usually an outpatient procedure (you will not stay overnight in a hospital). An cut (incision) is  made through the skin in the groin. The hernia is put back inside the abdomen. The weak area in the muscles is then repaired by herniorrhaphy or hernioplasty. Herniorrhaphy: in this type of surgery, the weak muscles are sewn back together. Hernioplasty: a patch or mesh is used to close the weak area in the abdominal wall.  Laparoscopy. In this procedure, a surgeon makes small incisions. A thin tube with a tiny video camera (called a laparoscope) is put into the abdomen. The surgeon repairs the hernia with mesh by looking with  the video camera and using two long instruments. HOME CARE INSTRUCTIONS   After surgery to repair an inguinal hernia:  You will need to take pain medicine prescribed by your healthcare provider. Follow all directions carefully.  You will need to take care of the wound from the incision.  Your activity will be restricted for awhile. This will probably include no heavy lifting for several weeks. You also should not do anything too active for a few weeks. When you can return to work will depend on the type of job that you have.  During "watchful waiting" periods, you should:  Maintain a healthy weight.  Eat a diet high in fiber (fruits, vegetables and whole grains).  Drink plenty of fluids to avoid constipation. This means drinking enough water and other liquids to keep your urine clear or pale yellow.  Do not lift heavy objects.  Do not stand for long periods of time.  Quit smoking. This should keep you from developing a frequent cough. SEEK MEDICAL CARE IF:   A bulge develops in your groin area.  You feel pain, a burning sensation or pressure in the groin. This might be worse if you are lifting or straining.  You develop a fever of more than 100.5 F (38.1 C). SEEK IMMEDIATE MEDICAL CARE IF:   Pain in the groin increases suddenly.  A bulge in the groin gets bigger suddenly and does not go down.  For men, there is sudden pain in the scrotum. Or, the size of the scrotum increases.  A bulge in the groin area becomes red or purple and is painful to touch.  You have nausea or vomiting that does not go away.  You feel your heart beating much faster than normal.  You cannot have a bowel movement or pass gas.  You develop a fever of more than 102.0 F (38.9 C).   This information is not intended to replace advice given to you by your health care provider. Make sure you discuss any questions you have with your health care provider.   Document Released: 12/26/2008 Document  Revised: 11/01/2011 Document Reviewed: 02/10/2015 Elsevier Interactive Patient Education [employer].

## 2016-06-02 NOTE — Assessment & Plan Note (Signed)
Doubt UTI given lack of other urinary symptoms and history and exam more suggestive of a hernia but will check UA, UCx Will go to lab today and give urine sample

## 2016-06-02 NOTE — Progress Notes (Signed)
Pre visit review using our clinic review tool, if applicable. No additional management support is needed unless otherwise documented below in the visit note. 

## 2016-06-02 NOTE — Assessment & Plan Note (Signed)
Probable inguinal hernia Discussed referral to surgery, but she declined Discussed concern with incarcerated hernia and that this will not get better She would like to monitor for now Will call if she would like a referral to surgery

## 2016-06-03 LAB — URINE CULTURE

## 2016-07-07 ENCOUNTER — Ambulatory Visit: Payer: PPO | Admitting: Nurse Practitioner

## 2016-07-12 ENCOUNTER — Encounter: Payer: Self-pay | Admitting: Nurse Practitioner

## 2016-07-12 NOTE — Progress Notes (Signed)
Patient ID: Madison Wright, female   DOB: Jan 28, 1946, 70 y.o.   MRN: [id number]  70 y.o. G0P0000 Married  Caucasian Fe here for annual exam.  Only using Estrace cream once every 2 months. Still works part time - 15 hours.  Patient's last menstrual period was 05/23/2001 (approximate).          Sexually active: No.  The current method of family planning is abstinence and post menopausal status.    Exercising: Yes.    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 Smoker:  no  Health Maintenance: Pap: 12/14/12, Negative MMG:  07/29/15, Bi-Rads 1:  Negative, scheduled for 08/04/16 @ [facility] Colonoscopy:  10/25/12, Mild diverticulosis, repeat in 10 years, Dr Olevia Perches BMD:  07/29/15, T-Score -2.0 Spine / -2.5 Right Femur / -2.3 Left Femur = Reclast 11/27/15 TD:  03/06/08 Shingles: 03/03/12 Pneumonia: 04/30/15 Pneumovax, 09/12/13 Prevnar-13 Hep C: done today HIV: Not indicated due to age Labs: PCP takes care of screening labs    reports that she has never smoked. She has never used smokeless tobacco. She reports that she does not drink alcohol or use drugs.  Past Medical History:  Diagnosis Date  . Diverticulosis of colon   . Granuloma annulare 2008   Dr Amy Martinique  . Menopausal state 11/2000   Took HRT X 10 days only  in 05/2001 secondary to [facility] breast ca  . Osteopenia 07/13/2012   BMD. spine -2.1, R hip neck -2.2, total R -2.0  . Post-menopausal atrophic vaginitis 07/2005   Started on Vagifem  . Vitamin D deficiency     Past Surgical History:  Procedure Laterality Date  . COLONOSCOPY  2004  & 2014   diverticulosis, Dr Olevia Perches  . SKIN CANCER EXCISION  2003   Basal skin  . WRIST FRACTURE SURGERY  2012   [facility]    Current Outpatient Prescriptions  Medication Sig Dispense Refill  . estradiol (ESTRACE) 0.1 MG/GM vaginal cream Use 1/2 g vaginally twice weekly 42.5 g 3  . fluconazole (DIFLUCAN) 150 MG tablet Take 1 tablet (150 mg total) by mouth once. Take one tablet.  Repeat in 48  hours if symptoms are not completely resolved. 2 tablet 0  . Multiple Vitamin (MULTIVITAMIN) tablet Take 1 tablet by mouth daily.      Marland Kitchen nystatin (NYSTATIN) powder Use topically BID 15 g 1   No current facility-administered medications for this visit.     Family History  Problem Relation Age of Onset  . Arthritis Mother   . Osteoporosis Mother     died after fall with fx. hips and PE  . Stroke Mother     post immobilization with vertebral fracture  . Colon cancer Paternal Aunt 23  . Heart disease Paternal Aunt     X 2; both had MI > 46  . Diabetes Paternal Aunt   . COPD Father   . Heart failure Paternal Grandmother   . Migraines Mother   . Cancer - Colon      nephew - chemo    ROS:  Pertinent items are noted in HPI.  Otherwise, a comprehensive ROS was negative.  Exam:   LMP 05/23/2001 (Approximate)    Ht Readings from Last 3 Encounters:  06/02/16 5\' 3"  (1.6 m)  01/13/16 5\' 3"  (1.6 m)  11/27/15 5\' 3"  (1.6 m)    General appearance: alert, cooperative and appears stated age Head: Normocephalic, without obvious abnormality, atraumatic Neck: no adenopathy, supple, symmetrical, trachea midline and thyroid  normal to inspection and palpation Lungs: clear to auscultation bilaterally Breasts: normal appearance, no masses or tenderness Heart: regular rate and rhythm Abdomen: soft, non-tender; no masses,  no organomegaly Extremities: extremities normal, atraumatic, no cyanosis or edema Skin: Skin color, texture, turgor normal. No rashes or lesions Lymph nodes: Cervical, supraclavicular, and axillary nodes normal. No abnormal inguinal nodes palpated Neurologic: Grossly normal   Pelvic: External genitalia:  no lesions              Urethra:  normal appearing urethra with no masses, tenderness or lesions              Bartholin's and Skene's: normal                 Vagina: normal appearing vagina with normal color and discharge, no lesions              Cervix: anteverted               Pap taken: Yes.   per request Bimanual Exam:  Uterus:  normal size, contour, position, consistency, mobility, non-tender              Adnexa: no mass, fullness, tenderness               Rectovaginal: Confirms               Anus:  normal sphincter tone, no lesions  Chaperone present: yes  A:  Well Woman with normal exam  Postmenopausal no HRT Atrophic vaginitis - only occasional use of vaginal estrogen and will stop after this tube             Borderline Osteoporosis - last dose of Reclast IV 11/27/15   P:   Reviewed health and wellness pertinent to exam  Pap smear as above  Mammogram is due 08/04/16  Will not refill Estrace vaginal cream  Plans are to repeat BMD 12/18 then repeat Reclast if needed  Follow with labs  Counseled on breast self exam, mammography screening, adequate intake of calcium and vitamin D, diet and exercise, Kegel's exercises return annually or prn  An After Visit Summary was printed and given to the patient.

## 2016-07-13 ENCOUNTER — Ambulatory Visit (INDEPENDENT_AMBULATORY_CARE_PROVIDER_SITE_OTHER): Payer: PPO | Admitting: Nurse Practitioner

## 2016-07-13 ENCOUNTER — Encounter: Payer: Self-pay | Admitting: Nurse Practitioner

## 2016-07-13 VITALS — BP 134/82 | HR 72 | Ht 63.25 in | Wt 146.0 lb

## 2016-07-13 DIAGNOSIS — Z0000 Encounter for general adult medical examination without abnormal findings: Secondary | ICD-10-CM

## 2016-07-13 DIAGNOSIS — Z1159 Encounter for screening for other viral diseases: Secondary | ICD-10-CM | POA: Diagnosis not present

## 2016-07-13 DIAGNOSIS — Z01419 Encounter for gynecological examination (general) (routine) without abnormal findings: Secondary | ICD-10-CM

## 2016-07-13 NOTE — Patient Instructions (Signed)

## 2016-07-14 LAB — HEPATITIS C ANTIBODY: HCV AB: NEGATIVE

## 2016-07-14 NOTE — Progress Notes (Signed)
Reviewed personally.  M. Suzanne Aryon Nham, MD.  

## 2016-07-16 LAB — PAP IG (IMAGE GUIDED)

## 2016-08-11 ENCOUNTER — Ambulatory Visit: Payer: PPO | Admitting: Nurse Practitioner

## 2016-08-18 DIAGNOSIS — Z1231 Encounter for screening mammogram for malignant neoplasm of breast: Secondary | ICD-10-CM | POA: Diagnosis not present

## 2016-08-18 DIAGNOSIS — Z803 Family history of malignant neoplasm of breast: Secondary | ICD-10-CM | POA: Diagnosis not present

## 2016-08-18 DIAGNOSIS — M810 Age-related osteoporosis without current pathological fracture: Secondary | ICD-10-CM | POA: Diagnosis not present

## 2016-08-18 LAB — HM MAMMOGRAPHY

## 2016-08-19 LAB — HM DEXA SCAN

## 2016-08-21 ENCOUNTER — Encounter: Payer: Self-pay | Admitting: Internal Medicine

## 2016-08-26 ENCOUNTER — Encounter: Payer: Self-pay | Admitting: Nurse Practitioner

## 2016-08-27 ENCOUNTER — Telehealth: Payer: Self-pay | Admitting: Nurse Practitioner

## 2016-08-27 NOTE — Telephone Encounter (Signed)
Please let pt know that BMD done on 08/18/16 shows a T Score at the spine of -1.30; right hip neck at -2.30; left hip neck at -2.50.  She falls in the Osteoporosis range.  But in comparison to previous exam 07/26/14 the spine shows + 9%;  The right hip at + 6%; the left hip at no change.  Overall the Reclast has done great for her. the last dose of Reclast was 11/06/15.   Will send note to Dr. Sabra Heck for recommendations about next dose of Reclast.  She still must continue with walking program and maintain good calcium and Vit D support.

## 2016-08-27 NOTE — Telephone Encounter (Signed)
Pt still has osteoporosis, even with improvement from prior two Reclast infusions.  If she did not have problems with the infusions, I would give two more and then repeat BMD.  Likely, will have further improvement and then can do drug holiday.

## 2016-08-30 NOTE — Telephone Encounter (Signed)
Spoke with patient. Advised of message as seen below from Kem Boroughs, [place] and [place]. Patient verbalizes understanding and would like to proceed with Reclast. Last Reclast was given 11/27/2015. Patient will not be due for next Reclast until after 11/26/2016. Patient's PA for Reclast has expired. Will need new PA through [employer]. Patient is aware we will start this process. Once we have heard back from her insurance company regarding approval or denial we will proceed with scheduling lab work in the office if approved. Lab work needs to be done close to 11/26/2016 date. Patient is agreeable.  Routing to [employer] to start PA process for Reclast.

## 2016-09-07 ENCOUNTER — Encounter: Payer: Self-pay | Admitting: Geriatric Medicine

## 2016-09-07 NOTE — Progress Notes (Signed)
Resulted by [facility], abstracted and sent to scan.

## 2016-09-13 ENCOUNTER — Telehealth: Payer: Self-pay | Admitting: Obstetrics & Gynecology

## 2016-09-13 NOTE — Telephone Encounter (Signed)
Prior authorization initiated for Reclast through [employer] online portal.   Printed pending authorization page for records.

## 2016-09-20 NOTE — Telephone Encounter (Signed)
Spoke with patient. Advised PA for Reclast has been approved from 09/14/2016-08/22/2017 (form to scan). Patient is due for next Reclast after 11/26/2016 as last Reclast infusion was on 11/27/2015. Patient is scheduled to have CMP checked on 11/24/2016 at 9 am. Aware once these results are back we can move forward with scheduling Reclast. Patient is agreeable.  Cc: Theresia Lo  Routing to provider for final review. Patient agreeable to disposition. Will close encounter.

## 2016-11-17 ENCOUNTER — Ambulatory Visit (INDEPENDENT_AMBULATORY_CARE_PROVIDER_SITE_OTHER): Payer: PPO | Admitting: Nurse Practitioner

## 2016-11-17 ENCOUNTER — Encounter: Payer: Self-pay | Admitting: Nurse Practitioner

## 2016-11-17 VITALS — BP 130/82 | HR 64 | Ht 63.25 in | Wt 143.0 lb

## 2016-11-17 DIAGNOSIS — R1903 Right lower quadrant abdominal swelling, mass and lump: Secondary | ICD-10-CM | POA: Diagnosis not present

## 2016-11-17 NOTE — Patient Instructions (Signed)
Will get CT scan of pelvis and abdomen.  Aware that if symptoms worsens to call back ASAP.

## 2016-11-17 NOTE — Progress Notes (Signed)
GYNECOLOGY  VISIT   HPI: 71 y.o.   Married  Caucasian  female   G0P0000 with Patient's last menstrual period was 05/23/2001 (approximate).   here for concerns about a ' knot' in the groin right abdomen. She was at work [employer] on Monday.  After her shift of 5-6 hours she noted a mass in the RLQ.  Seemed to cause pressure but not pain.  The bulge stayed there for awhile and did not become discolored or uncomfortable.  She did not work yesterday and rested more.  Concerned that she may have something wrong and called.  She denies constipation, diarrhea, feer/ chills/ blood or mucous with stool.  No prior surgery in the abdomen.  She can still feel that something is just not right. No other GI or GU symptoms.    GYNECOLOGIC HISTORY: Patient's last menstrual period was 05/23/2001 (approximate).  Menopausal hormone therapy: Estrace vaginal cream only Reclast dose is due 11/2016    OB History    Gravida Para Term Preterm AB Living   0 0 0 0 0 0   SAB TAB Ectopic Multiple Live Births   0 0 0 0 0         Patient Active Problem List   Diagnosis Date Noted  . Suprapubic distress, right 06/02/2016  . Urinary frequency 06/02/2016  . Prediabetes 11/20/2015  . Vitamin D deficiency 10/21/2015  . Elevated blood pressure (not hypertension) 10/21/2015  . Diverticulosis of large intestine 01/07/2010  . Osteoporosis 01/07/2010  . Hyperlipidemia 03/06/2008  . GRANULOMA ANNULARE 03/06/2008    Past Medical History:  Diagnosis Date  . Diverticulosis of colon   . Granuloma annulare 2008   Dr Amy Martinique  . Menopausal state 11/2000   Took HRT X 10 days only  in 05/2001 secondary to [facility] breast ca  . Osteopenia 07/13/2012   BMD. spine -2.1, R hip neck -2.2, total R -2.0  . Post-menopausal atrophic vaginitis 07/2005   Started on Vagifem  . Vitamin D deficiency     Past Surgical History:  Procedure Laterality Date  . COLONOSCOPY  2004  & 2014   diverticulosis, Dr Olevia Perches  . SKIN CANCER EXCISION   2003   Basal skin  . WRIST FRACTURE SURGERY  2012   [facility]    Current Outpatient Prescriptions  Medication Sig Dispense Refill  . estradiol (ESTRACE) 0.1 MG/GM vaginal cream Use 1/2 g vaginally twice weekly 42.5 g 3  . Multiple Vitamin (MULTIVITAMIN) tablet Take 1 tablet by mouth daily.      Marland Kitchen nystatin (NYSTATIN) powder Use topically BID 15 g 1   No current facility-administered medications for this visit.      ALLERGIES: Sulfonamide derivatives and Codeine  Family History  Problem Relation Age of Onset  . Arthritis Mother   . Osteoporosis Mother     died after fall with fx. hips and PE  . Stroke Mother     post immobilization with vertebral fracture  . Colon cancer Paternal Aunt 72  . Heart disease Paternal Aunt     X 2; both had MI > 2  . Diabetes Paternal Aunt   . COPD Father   . Heart failure Paternal Grandmother   . Migraines Mother   . Cancer - Colon      nephew - chemo    Social History   Social History  . Marital status: Married    Spouse name: Jolie Strohecker  . Number of children: 0  . Years  of education: N/A   Occupational History  . customer service [employer] History Main Topics  . Smoking status: Never Smoker  . Smokeless tobacco: Never Used  . Alcohol use No  . Drug use: No  . Sexual activity: Not Currently    Partners: Male    Birth control/ protection: Post-menopausal   Other Topics Concern  . Not on file   Social History Narrative   exercises    ROS  PHYSICAL EXAMINATION:    BP 130/82 (BP Location: Right Arm, Patient Position: Sitting, Cuff Size: Normal)   Pulse 64   Ht 5' 3.25" (1.607 m)   Wt 143 lb (64.9 kg)   LMP 05/23/2001 (Approximate)   BMI 25.13 kg/m      General appearance: alert, cooperative and appears stated age  Abdomen: soft, non-tender; bowel sounds normal; no masses,  no organomegaly.  On initial palpation there was a bulge in the right lower abdomen just above the inguinal area that could  represent a loop of colon.  With her in supine position it did not bulge again. With standing there was no bulge.  No rebounding, no scars, no flank pain. Remainder of abdomen is soft and non tender without mass.  Pelvic: External genitalia:  no lesions              Vagina: there is no mass on bimanual and could not elicit same type of bulge.                 Chaperone was present for exam.  ASSESSMENT   R/O Right inguinal hernia  PLAN  consult with Dr. Quincy Simmonds - will get CT of abdomen and pelvis and follow. She is made aware that if symptoms get worse in any way to call ASAP She is also aware that if there is discoloration at the bulge to also call ASAP   An After Visit Summary was printed and given to the patient.

## 2016-11-17 NOTE — Progress Notes (Signed)
Patient scheduled while in office. Spoke with Mardene Celeste at [facility]. Patient scheduled for CT of abdomen and pelvis on 11/23/16 at 8:30 am, arrive at 8 am for labs. No solid food 4 hours prior. Patient is scheduled at [address] location. Patient needs to stop by before Friday to pick up contrast. Patient verbalizes understanding and is agreeable.

## 2016-11-18 NOTE — Progress Notes (Signed)
Encounter reviewed by Dr. Brook Amundson C. Silva.  

## 2016-11-23 ENCOUNTER — Ambulatory Visit
Admission: RE | Admit: 2016-11-23 | Discharge: 2016-11-23 | Disposition: A | Discharge status: Home / Self Care | Payer: PPO | Source: Ambulatory Visit | Attending: Nurse Practitioner | Admitting: Nurse Practitioner

## 2016-11-23 DIAGNOSIS — R1903 Right lower quadrant abdominal swelling, mass and lump: Secondary | ICD-10-CM

## 2016-11-23 MED ORDER — IOPAMIDOL (ISOVUE-300) INJECTION 61%
100.0000 mL | Freq: Once | INTRAVENOUS | Status: AC | PRN
Start: 2016-11-23 — End: 2016-11-23
  Administered 2016-11-23: 100 mL via INTRAVENOUS

## 2016-11-24 ENCOUNTER — Other Ambulatory Visit (INDEPENDENT_AMBULATORY_CARE_PROVIDER_SITE_OTHER): Payer: PPO

## 2016-11-24 ENCOUNTER — Other Ambulatory Visit: Payer: Self-pay | Admitting: *Deleted

## 2016-11-24 DIAGNOSIS — M810 Age-related osteoporosis without current pathological fracture: Secondary | ICD-10-CM

## 2016-11-24 DIAGNOSIS — K4090 Unilateral inguinal hernia, without obstruction or gangrene, not specified as recurrent: Secondary | ICD-10-CM

## 2016-11-24 LAB — COMPREHENSIVE METABOLIC PANEL
ALK PHOS: 62 U/L (ref 33–130)
ALT: 15 U/L (ref 6–29)
AST: 28 U/L (ref 10–35)
Albumin: 3.9 g/dL (ref 3.6–5.1)
BUN: 5 mg/dL — AB (ref 7–25)
CO2: 23 mmol/L (ref 20–31)
CREATININE: 0.58 mg/dL (ref 0.50–0.99)
Calcium: 9 mg/dL (ref 8.6–10.4)
Chloride: 102 mmol/L (ref 98–110)
GLUCOSE: 107 mg/dL — AB (ref 65–99)
Potassium: 3.9 mmol/L (ref 3.5–5.3)
SODIUM: 141 mmol/L (ref 135–146)
TOTAL PROTEIN: 6.3 g/dL (ref 6.1–8.1)
Total Bilirubin: 0.8 mg/dL (ref 0.2–1.2)

## 2016-11-29 ENCOUNTER — Telehealth: Payer: Self-pay | Admitting: Nurse Practitioner

## 2016-11-29 ENCOUNTER — Telehealth: Payer: Self-pay | Admitting: *Deleted

## 2016-11-29 NOTE — Telephone Encounter (Signed)
Patient was seen on 11/17/2016 for RLQ abdominal mass. Patient was sent for CT which showed possible small hernias. Referral placed to [facility]. Patient is scheduled on 12/09/2016 at 8:30 am with Dr.Wilson. [facility] has contacted the patient regarding this appointment and left a detailed message on her voicemail.  Left detailed message at number provided [phone], advised of appointment date and time with [facility]. Advised she may contact [facility] or our office with any further questions.   Cc: Theresia Lo  Routing to provider for final review. Patient agreeable to disposition. Will close encounter.

## 2016-11-29 NOTE — Telephone Encounter (Signed)
See previous telephone encounter dated 11/29/16, will close this encounter.

## 2016-11-29 NOTE — Telephone Encounter (Signed)
Spoke with patient. Patient call to clarify appointment details left on voicemail. Advised patient as seen below. Patient verbalizes understanding and is agreeable to date and time.  Routing to provider for final review. Patient is agreeable to disposition. Will close encounter.  Cc: Reesa Chew, Kem Boroughs, NP

## 2016-11-29 NOTE — Telephone Encounter (Signed)
[facility] is calling to report that patient has an appointment 12/09/16 @8 :30 am with Dr.Wilson and details were left on patient's voicemail.

## 2016-11-30 ENCOUNTER — Telehealth: Payer: Self-pay | Admitting: General Practice

## 2016-11-30 ENCOUNTER — Telehealth: Payer: Self-pay | Admitting: Nurse Practitioner

## 2016-11-30 NOTE — Telephone Encounter (Signed)
I called the patient to confirm that Dr. Linna Darner is no longer her PCP, but there was no answer. [name] ([place])

## 2016-11-30 NOTE — Telephone Encounter (Signed)
Madison Wright called me back and confirmed that Dr. Quay Burow is her PCP and Dr. Elna Breslow is her husband's PCP. [name] ([place])

## 2016-11-30 NOTE — Telephone Encounter (Signed)
Kem Boroughs, NP -see patient message below and advise?

## 2016-11-30 NOTE — Telephone Encounter (Signed)
Patient had a CT scan done last week and she would like to get PG opinion on it.

## 2016-12-03 NOTE — Telephone Encounter (Signed)
Pt was left a message that I was trying to get I touch with her about test results.

## 2016-12-06 ENCOUNTER — Telehealth: Payer: Self-pay | Admitting: Nurse Practitioner

## 2016-12-06 NOTE — Telephone Encounter (Signed)
Pt returned call and was told about CT scan.  She is going Thursday am to see the surgeon.  She is comfortable with plan.

## 2016-12-09 DIAGNOSIS — K4090 Unilateral inguinal hernia, without obstruction or gangrene, not specified as recurrent: Secondary | ICD-10-CM | POA: Diagnosis not present

## 2017-01-07 DIAGNOSIS — K4020 Bilateral inguinal hernia, without obstruction or gangrene, not specified as recurrent: Secondary | ICD-10-CM | POA: Diagnosis not present

## 2017-03-15 ENCOUNTER — Telehealth: Payer: Self-pay | Admitting: Obstetrics & Gynecology

## 2017-03-15 NOTE — Telephone Encounter (Signed)
Left message for pt to reschedule [address] appt.

## 2017-04-20 DIAGNOSIS — H2513 Age-related nuclear cataract, bilateral: Secondary | ICD-10-CM | POA: Diagnosis not present

## 2017-06-07 DIAGNOSIS — D1801 Hemangioma of skin and subcutaneous tissue: Secondary | ICD-10-CM | POA: Diagnosis not present

## 2017-06-07 DIAGNOSIS — D2261 Melanocytic nevi of right upper limb, including shoulder: Secondary | ICD-10-CM | POA: Diagnosis not present

## 2017-06-07 DIAGNOSIS — L821 Other seborrheic keratosis: Secondary | ICD-10-CM | POA: Diagnosis not present

## 2017-06-07 DIAGNOSIS — Z85828 Personal history of other malignant neoplasm of skin: Secondary | ICD-10-CM | POA: Diagnosis not present

## 2017-06-11 ENCOUNTER — Ambulatory Visit (INDEPENDENT_AMBULATORY_CARE_PROVIDER_SITE_OTHER): Payer: PPO

## 2017-06-11 DIAGNOSIS — Z23 Encounter for immunization: Secondary | ICD-10-CM | POA: Diagnosis not present

## 2017-07-20 ENCOUNTER — Ambulatory Visit: Payer: PPO | Admitting: Nurse Practitioner

## 2017-07-27 ENCOUNTER — Ambulatory Visit (INDEPENDENT_AMBULATORY_CARE_PROVIDER_SITE_OTHER): Payer: PPO | Admitting: Obstetrics and Gynecology

## 2017-07-27 ENCOUNTER — Encounter: Payer: Self-pay | Admitting: Obstetrics and Gynecology

## 2017-07-27 ENCOUNTER — Other Ambulatory Visit: Payer: Self-pay

## 2017-07-27 VITALS — BP 140/70 | HR 84 | Resp 16 | Ht 63.0 in | Wt 147.0 lb

## 2017-07-27 DIAGNOSIS — M818 Other osteoporosis without current pathological fracture: Secondary | ICD-10-CM

## 2017-07-27 DIAGNOSIS — Z01419 Encounter for gynecological examination (general) (routine) without abnormal findings: Secondary | ICD-10-CM | POA: Diagnosis not present

## 2017-07-27 NOTE — Patient Instructions (Signed)
EXERCISE AND DIET:  We recommended that you start or continue a regular exercise program for good health. Regular exercise means any activity that makes your heart beat faster and makes you sweat.  We recommend exercising at least 30 minutes per day at least 3 days a week, preferably 4 or 5.  We also recommend a diet low in fat and sugar.  Inactivity, poor dietary choices and obesity can cause diabetes, heart attack, stroke, and kidney damage, among others.    ALCOHOL AND SMOKING:  Women should limit their alcohol intake to no more than 7 drinks/beers/glasses of wine (combined, not each!) per week. Moderation of alcohol intake to this level decreases your risk of breast cancer and liver damage. And of course, no recreational drugs are part of a healthy lifestyle.  And absolutely no smoking or even second hand smoke. Most people know smoking can cause heart and lung diseases, but did you know it also contributes to weakening of your bones? Aging of your skin?  Yellowing of your teeth and nails?  CALCIUM AND VITAMIN D:  Adequate intake of calcium and Vitamin D are recommended.  The recommendations for exact amounts of these supplements seem to change often, but generally speaking 600 mg of calcium (either carbonate or citrate) and 800 units of Vitamin D per day seems prudent. Certain women may benefit from higher intake of Vitamin D.  If you are among these women, your doctor will have told you during your visit.    PAP SMEARS:  Pap smears, to check for cervical cancer or precancers,  have traditionally been done yearly, although recent scientific advances have shown that most women can have pap smears less often.  However, every woman still should have a physical exam from her gynecologist every year. It will include a breast check, inspection of the vulva and vagina to check for abnormal growths or skin changes, a visual exam of the cervix, and then an exam to evaluate the size and shape of the uterus and  ovaries.  And after 69 years of age, a rectal exam is indicated to check for rectal cancers. We will also provide age appropriate advice regarding health maintenance, like when you should have certain vaccines, screening for sexually transmitted diseases, bone density testing, colonoscopy, mammograms, etc.   MAMMOGRAMS:  All women over 40 years old should have a yearly mammogram. Many facilities now offer a "3D" mammogram, which may cost around $50 extra out of pocket. If possible,  we recommend you accept the option to have the 3D mammogram performed.  It both reduces the number of women who will be called back for extra views which then turn out to be normal, and it is better than the routine mammogram at detecting truly abnormal areas.    COLONOSCOPY:  Colonoscopy to screen for colon cancer is recommended for all women at age 50.  We know, you hate the idea of the prep.  We agree, BUT, having colon cancer and not knowing it is worse!!  Colon cancer so often starts as a polyp that can be seen and removed at colonscopy, which can quite literally save your life!  And if your first colonoscopy is normal and you have no family history of colon cancer, most women don't have to have it again for 10 years.  Once every ten years, you can do something that may end up saving your life, right?  We will be happy to help you get it scheduled when you are ready.    Be sure to check your insurance coverage so you understand how much it will cost.  It may be covered as a preventative service at no cost, but you should check your particular policy.      Breast Self-Awareness Breast self-awareness means being familiar with how your breasts look and feel. It involves checking your breasts regularly and reporting any changes to your health care provider. Practicing breast self-awareness is important. A change in your breasts can be a sign of a serious medical problem. Being familiar with how your breasts look and feel allows  you to find any problems early, when treatment is more likely to be successful. All women should practice breast self-awareness, including women who have had breast implants. How to do a breast self-exam One way to learn what is normal for your breasts and whether your breasts are changing is to do a breast self-exam. To do a breast self-exam: Look for Changes  1. Remove all the clothing above your waist. 2. Stand in front of a mirror in a room with good lighting. 3. Put your hands on your hips. 4. Push your hands firmly downward. 5. Compare your breasts in the mirror. Look for differences between them (asymmetry), such as: ? Differences in shape. ? Differences in size. ? Puckers, dips, and bumps in one breast and not the other. 6. Look at each breast for changes in your skin, such as: ? Redness. ? Scaly areas. 7. Look for changes in your nipples, such as: ? Discharge. ? Bleeding. ? Dimpling. ? Redness. ? A change in position. Feel for Changes  Carefully feel your breasts for lumps and changes. It is best to do this while lying on your back on the floor and again while sitting or standing in the shower or tub with soapy water on your skin. Feel each breast in the following way:  Place the arm on the side of the breast you are examining above your head.  Feel your breast with the other hand.  Start in the nipple area and make  inch (2 cm) overlapping circles to feel your breast. Use the pads of your three middle fingers to do this. Apply light pressure, then medium pressure, then firm pressure. The light pressure will allow you to feel the tissue closest to the skin. The medium pressure will allow you to feel the tissue that is a little deeper. The firm pressure will allow you to feel the tissue close to the ribs.  Continue the overlapping circles, moving downward over the breast until you feel your ribs below your breast.  Move one finger-width toward the center of the body.  Continue to use the  inch (2 cm) overlapping circles to feel your breast as you move slowly up toward your collarbone.  Continue the up and down exam using all three pressures until you reach your armpit.  Write Down What You Find  Write down what is normal for each breast and any changes that you find. Keep a written record with breast changes or normal findings for each breast. By writing this information down, you do not need to depend only on memory for size, tenderness, or location. Write down where you are in your menstrual cycle, if you are still menstruating. If you are having trouble noticing differences in your breasts, do not get discouraged. With time you will become more familiar with the variations in your breasts and more comfortable with the exam. How often should I examine my breasts? Examine   your breasts every month. If you are breastfeeding, the best time to examine your breasts is after a feeding or after using a breast pump. If you menstruate, the best time to examine your breasts is 5-7 days after your period is over. During your period, your breasts are lumpier, and it may be more difficult to notice changes. When should I see my health care provider? See your health care provider if you notice:  A change in shape or size of your breasts or nipples.  A change in the skin of your breast or nipples, such as a reddened or scaly area.  Unusual discharge from your nipples.  A lump or thick area that was not there before.  Pain in your breasts.  Anything that concerns you.  This information is not intended to replace advice given to you by your health care provider. Make sure you discuss any questions you have with your health care provider. Document Released: 08/09/2005 Document Revised: 01/15/2016 Document Reviewed: 06/29/2015 Elsevier Interactive Patient Education  2018 Elsevier Inc.  

## 2017-07-27 NOTE — Progress Notes (Signed)
71 y.o. G0P0000 MarriedCaucasianF here for annual exam.  No vaginal bleeding. Not sexually active. No bowel or bladder c/o.     Patient's last menstrual period was 05/23/2001 (approximate).          Sexually active: No.  The current method of family planning is post menopausal status.    Exercising: Yes.    walking Smoker:  no  Health Maintenance: Pap:  07-13-16 WNL 12-14-12 WNL  History of abnormal Pap:  no MMG:  08-18-16 WNL  Colonoscopy:  10-25-12 WNL repeat in 10 yrs BMD:   08-19-16 osteopenia, had reclast in 4/17. In 1/18 DEXA with osteoporosis, never got her next Reclast  TDaP:  2009 Gardasil: N/A   reports that  has never smoked. she has never used smokeless tobacco. She reports that she does not drink alcohol or use drugs.  Past Medical History:  Diagnosis Date  . Diverticulosis of colon   . Granuloma annulare 2008   Dr Amy Martinique  . Menopausal state 11/2000   Took HRT X 10 days only  in 05/2001 secondary to [facility] breast ca  . Osteoporosis   . Post-menopausal atrophic vaginitis 07/2005  . Vitamin D deficiency     Past Surgical History:  Procedure Laterality Date  . COLONOSCOPY  2004  & 2014   diverticulosis, Dr Olevia Perches  . SKIN CANCER EXCISION  2003   [facility] skin  . WRIST FRACTURE SURGERY  2012   [facility]    Current Outpatient Medications  Medication Sig Dispense Refill  . Multiple Vitamin (MULTIVITAMIN) tablet Take 1 tablet by mouth daily.       No current facility-administered medications for this visit.     Family History  Problem Relation Age of Onset  . Arthritis Mother   . Osteoporosis Mother        died after fall with fx. hips and PE  . Stroke Mother        post immobilization with vertebral fracture  . Migraines Mother   . COPD Father   . Colon cancer Paternal Aunt 19  . Heart disease Paternal Aunt        X 2; both had MI > 66  . Diabetes Paternal Aunt   . Heart failure Paternal Grandmother   . Cancer - Colon Unknown        nephew - chemo     Review of Systems  Constitutional: Negative.   HENT: Negative.   Eyes: Negative.   Respiratory: Negative.   Cardiovascular: Negative.   Gastrointestinal: Negative.   Endocrine: Negative.   Genitourinary: Negative.   Musculoskeletal: Negative.   Skin: Negative.   Allergic/Immunologic: Negative.   Neurological: Negative.   Psychiatric/Behavioral: Negative.     Exam:   BP 140/70 (BP Location: Right Arm, Patient Position: Sitting, Cuff Size: Normal)   Pulse 84   Resp 16   Ht 5\' 3"  (1.6 m)   Wt 147 lb (66.7 kg)   LMP 05/23/2001 (Approximate)   BMI 26.04 kg/m   Weight change: @WEIGHTCHANGE @ Height:   Height: 5\' 3"  (160 cm)  Ht Readings from Last 3 Encounters:  07/27/17 5\' 3"  (1.6 m)  11/17/16 5' 3.25" (1.607 m)  07/13/16 5' 3.25" (1.607 m)    General appearance: alert, cooperative and appears stated age Head: Normocephalic, without obvious abnormality, atraumatic Neck: no adenopathy, supple, symmetrical, trachea midline and thyroid normal to inspection and palpation Lungs: clear to auscultation bilaterally Cardiovascular: regular rate and rhythm Breasts: normal appearance, no masses or tenderness Abdomen:  soft, non-tender; non distended,  no masses,  no organomegaly Extremities: extremities normal, atraumatic, no cyanosis or edema Skin: Skin color, texture, turgor normal. No rashes or lesions Lymph nodes: Cervical, supraclavicular, and axillary nodes normal. No abnormal inguinal nodes palpated Neurologic: Grossly normal   Pelvic: External genitalia:  no lesions              Urethra:  normal appearing urethra with no masses, tenderness or lesions              Bartholins and Skenes: normal                 Vagina: normal appearing atrophic vagina with normal color and discharge, no lesions              Cervix: no lesions               Bimanual Exam:  Uterus:  normal size, contour, position, consistency, mobility, non-tender              Adnexa: no mass, fullness,  tenderness               Rectovaginal: Confirms               Anus:  normal sphincter tone, no lesions  Chaperone was present for exam.  A:  Well Woman with normal exam  H/O osteoporosis, was supposed to get Reclast in 4/18 didn't get it  P:   No pap   Will check on Reclast  Discussed DEXA, not officially due until next year. It would be nice to get a baseline prior to restarting medication  Colonoscopy UTD  Immunizations and labs with primary  Discussed breast self exam  Discussed calcium and vit D intake  She will get a vit d checked with her primary and have the results of all of her labs sent here

## 2017-07-28 ENCOUNTER — Telehealth: Payer: Self-pay

## 2017-07-28 NOTE — Telephone Encounter (Deleted)
-----   Message from Salvadore Dom, MD sent at 07/27/2017  5:31 PM EST ----- Hi [name], I think this patient fell through the cracks. She was supposed to have Reclast in April and never did. We were supposed to get approval and set it up. Can you please work on that. She theoretically isn't due for another DEXA for until 1/20, do you think her insurance will cover a DEXA  In 1/19 (it would be nice to see what her bone density is prior to restarting the reclast), if not I would just do the reclast. She is supposed to have lab work with her primary and have it sent here.  Thanks, Sharee Pimple

## 2017-07-28 NOTE — Telephone Encounter (Signed)
Left message to call [place] at [phone].  Need to discuss scheduling patient's Reclast. Will need lab results from patients PCP to order Reclast. Reclast was approved through [facility] on 09/14/2016-08/12/2018. Authorization number is [id number].

## 2017-07-29 NOTE — Telephone Encounter (Signed)
Spoke with patient, advised Madison Wright is out of the office, will return on Monday pending inclement weather. Madison Wright will f/u when office reopens regarding Reclast. Patient is agreeable.

## 2017-07-29 NOTE — Telephone Encounter (Signed)
Patient returning Kaitlyn's call. °

## 2017-08-02 NOTE — Telephone Encounter (Signed)
Spoke with patient. Advised patient will need to have labs performed before Reclast can be given. Patient states she will call her PCP to schedule an appointment after the new year. Declines to proceed at this time. Will contact the office once labs are performed and will have lab results sent to [facility] for review. Aware will schedule Reclast at that time.  Routing to provider for final review. Patient agreeable to disposition. Will close encounter.

## 2017-08-19 ENCOUNTER — Telehealth: Payer: Self-pay | Admitting: Internal Medicine

## 2017-08-19 DIAGNOSIS — M818 Other osteoporosis without current pathological fracture: Secondary | ICD-10-CM

## 2017-08-19 NOTE — Telephone Encounter (Signed)
LVM informing pt that orders have been placed.

## 2017-08-19 NOTE — Telephone Encounter (Signed)
Copied from [place] [id number]. Topic: Quick Communication - See Telephone Encounter >> Aug 19, 2017 10:51 AM Cleaster Corin, NT wrote: CRM for notification. See Telephone encounter for:   08/19/17.  Pt. Calling and needs a referral to have bone density  test done same day as mammogram 08-24-2017 at 11:40am. Pt. Talked with office and they said they would be able to do that after her appt. [place] [facility] [place]. Pt. Can be reached at [phone]

## 2017-08-19 NOTE — Telephone Encounter (Signed)
ordered

## 2017-08-24 DIAGNOSIS — Z1231 Encounter for screening mammogram for malignant neoplasm of breast: Secondary | ICD-10-CM | POA: Diagnosis not present

## 2017-08-24 DIAGNOSIS — M810 Age-related osteoporosis without current pathological fracture: Secondary | ICD-10-CM | POA: Diagnosis not present

## 2017-08-24 LAB — HM DEXA SCAN

## 2017-08-24 LAB — HM MAMMOGRAPHY

## 2017-08-26 ENCOUNTER — Encounter: Payer: Self-pay | Admitting: Internal Medicine

## 2017-08-30 NOTE — Patient Instructions (Addendum)
Test(s) ordered today. Your results will be released to [facility] (or called to you) after review, usually within 72hours after test completion. If any changes need to be made, you will be notified at that same time.  No immunizations administered today.   Medications reviewed and updated.  No changes recommended at this time.   Please followup once a year for a physical exam   Health Maintenance, Female Adopting a healthy lifestyle and getting preventive care can go a long way to promote health and wellness. Talk with your health care provider about what schedule of regular examinations is right for you. This is a good chance for you to check in with your provider about disease prevention and staying healthy. In between checkups, there are plenty of things you can do on your own. Experts have done a lot of research about which lifestyle changes and preventive measures are most likely to keep you healthy. Ask your health care provider for more information. Weight and diet Eat a healthy diet  Be sure to include plenty of vegetables, fruits, low-fat dairy products, and lean protein.  Do not eat a lot of foods high in solid fats, added sugars, or salt.  Get regular exercise. This is one of the most important things you can do for your health. ? Most adults should exercise for at least 150 minutes each week. The exercise should increase your heart rate and make you sweat (moderate-intensity exercise). ? Most adults should also do strengthening exercises at least twice a week. This is in addition to the moderate-intensity exercise.  Maintain a healthy weight  Body mass index (BMI) is a measurement that can be used to identify possible weight problems. It estimates body fat based on height and weight. Your health care provider can help determine your BMI and help you achieve or maintain a healthy weight.  For females 101 years of age and older: ? A BMI below 18.5 is considered underweight. ? A  BMI of 18.5 to 24.9 is normal. ? A BMI of 25 to 29.9 is considered overweight. ? A BMI of 30 and above is considered obese.  Watch levels of cholesterol and blood lipids  You should start having your blood tested for lipids and cholesterol at 70 years of age, then have this test every 5 years.  You may need to have your cholesterol levels checked more often if: ? Your lipid or cholesterol levels are high. ? You are older than 70 years of age. ? You are at high risk for heart disease.  Cancer screening Lung Cancer  Lung cancer screening is recommended for adults 21-50 years old who are at high risk for lung cancer because of a history of smoking.  A yearly low-dose CT scan of the lungs is recommended for people who: ? Currently smoke. ? Have quit within the past 15 years. ? Have at least a 30-pack-year history of smoking. A pack year is smoking an average of one pack of cigarettes a day for 1 year.  Yearly screening should continue until it has been 15 years since you quit.  Yearly screening should stop if you develop a health problem that would prevent you from having lung cancer treatment.  Breast Cancer  Practice breast self-awareness. This means understanding how your breasts normally appear and feel.  It also means doing regular breast self-exams. Let your health care provider know about any changes, no matter how small.  If you are in your 20s or 30s, you should  have a clinical breast exam (CBE) by a health care provider every 1-3 years as part of a regular health exam.  If you are 46 or older, have a CBE every year. Also consider having a breast X-ray (mammogram) every year.  If you have a family history of breast cancer, talk to your health care provider about genetic screening.  If you are at high risk for breast cancer, talk to your health care provider about having an MRI and a mammogram every year.  Breast cancer gene (BRCA) assessment is recommended for women who  have family members with BRCA-related cancers. BRCA-related cancers include: ? Breast. ? Ovarian. ? Tubal. ? Peritoneal cancers.  Results of the assessment will determine the need for genetic counseling and BRCA1 and BRCA2 testing.  Cervical Cancer Your health care provider may recommend that you be screened regularly for cancer of the pelvic organs (ovaries, uterus, and vagina). This screening involves a pelvic examination, including checking for microscopic changes to the surface of your cervix (Pap test). You may be encouraged to have this screening done every 3 years, beginning at age 62.  For women ages 69-65, health care providers may recommend pelvic exams and Pap testing every 3 years, or they may recommend the Pap and pelvic exam, combined with testing for human papilloma virus (HPV), every 5 years. Some types of HPV increase your risk of cervical cancer. Testing for HPV may also be done on women of any age with unclear Pap test results.  Other health care providers may not recommend any screening for nonpregnant women who are considered low risk for pelvic cancer and who do not have symptoms. Ask your health care provider if a screening pelvic exam is right for you.  If you have had past treatment for cervical cancer or a condition that could lead to cancer, you need Pap tests and screening for cancer for at least 20 years after your treatment. If Pap tests have been discontinued, your risk factors (such as having a new sexual partner) need to be reassessed to determine if screening should resume. Some women have medical problems that increase the chance of getting cervical cancer. In these cases, your health care provider may recommend more frequent screening and Pap tests.  Colorectal Cancer  This type of cancer can be detected and often prevented.  Routine colorectal cancer screening usually begins at 70 years of age and continues through 70 years of age.  Your health care  provider may recommend screening at an earlier age if you have risk factors for colon cancer.  Your health care provider may also recommend using home test kits to check for hidden blood in the stool.  A small camera at the end of a tube can be used to examine your colon directly (sigmoidoscopy or colonoscopy). This is done to check for the earliest forms of colorectal cancer.  Routine screening usually begins at age 74.  Direct examination of the colon should be repeated every 5-10 years through 70 years of age. However, you may need to be screened more often if early forms of precancerous polyps or small growths are found.  Skin Cancer  Check your skin from head to toe regularly.  Tell your health care provider about any new moles or changes in moles, especially if there is a change in a mole's shape or color.  Also tell your health care provider if you have a mole that is larger than the size of a pencil eraser.  Always  use sunscreen. Apply sunscreen liberally and repeatedly throughout the day.  Protect yourself by wearing long sleeves, pants, a wide-brimmed hat, and sunglasses whenever you are outside.  Heart disease, diabetes, and high blood pressure  High blood pressure causes heart disease and increases the risk of stroke. High blood pressure is more likely to develop in: ? People who have blood pressure in the high end of the normal range (130-139/85-89 mm Hg). ? People who are overweight or obese. ? People who are African American.  If you are 35-48 years of age, have your blood pressure checked every 3-5 years. If you are 51 years of age or older, have your blood pressure checked every year. You should have your blood pressure measured twice-once when you are at a hospital or clinic, and once when you are not at a hospital or clinic. Record the average of the two measurements. To check your blood pressure when you are not at a hospital or clinic, you can use: ? An automated  blood pressure machine at a pharmacy. ? A home blood pressure monitor.  If you are between 4 years and 44 years old, ask your health care provider if you should take aspirin to prevent strokes.  Have regular diabetes screenings. This involves taking a blood sample to check your fasting blood sugar level. ? If you are at a normal weight and have a low risk for diabetes, have this test once every three years after 70 years of age. ? If you are overweight and have a high risk for diabetes, consider being tested at a younger age or more often. Preventing infection Hepatitis B  If you have a higher risk for hepatitis B, you should be screened for this virus. You are considered at high risk for hepatitis B if: ? You were born in a country where hepatitis B is common. Ask your health care provider which countries are considered high risk. ? Your parents were born in a high-risk country, and you have not been immunized against hepatitis B (hepatitis B vaccine). ? You have HIV or AIDS. ? You use needles to inject street drugs. ? You live with someone who has hepatitis B. ? You have had sex with someone who has hepatitis B. ? You get hemodialysis treatment. ? You take certain medicines for conditions, including cancer, organ transplantation, and autoimmune conditions.  Hepatitis C  Blood testing is recommended for: ? Everyone born from [birth year] through 1963. ? Anyone with known risk factors for hepatitis C.  Sexually transmitted infections (STIs)  You should be screened for sexually transmitted infections (STIs) including gonorrhea and chlamydia if: ? You are sexually active and are younger than 70 years of age. ? You are older than 70 years of age and your health care provider tells you that you are at risk for this type of infection. ? Your sexual activity has changed since you were last screened and you are at an increased risk for chlamydia or gonorrhea. Ask your health care provider if you  are at risk.  If you do not have HIV, but are at risk, it may be recommended that you take a prescription medicine daily to prevent HIV infection. This is called pre-exposure prophylaxis (PrEP). You are considered at risk if: ? You are sexually active and do not regularly use condoms or know the HIV status of your partner(s). ? You take drugs by injection. ? You are sexually active with a partner who has HIV.  Talk with your  health care provider about whether you are at high risk of being infected with HIV. If you choose to begin PrEP, you should first be tested for HIV. You should then be tested every 3 months for as long as you are taking PrEP. Pregnancy  If you are premenopausal and you may become pregnant, ask your health care provider about preconception counseling.  If you may become pregnant, take 400 to 800 micrograms (mcg) of folic acid every day.  If you want to prevent pregnancy, talk to your health care provider about birth control (contraception). Osteoporosis and menopause  Osteoporosis is a disease in which the bones lose minerals and strength with aging. This can result in serious bone fractures. Your risk for osteoporosis can be identified using a bone density scan.  If you are 60 years of age or older, or if you are at risk for osteoporosis and fractures, ask your health care provider if you should be screened.  Ask your health care provider whether you should take a calcium or vitamin D supplement to lower your risk for osteoporosis.  Menopause may have certain physical symptoms and risks.  Hormone replacement therapy may reduce some of these symptoms and risks. Talk to your health care provider about whether hormone replacement therapy is right for you. Follow these instructions at home:  Schedule regular health, dental, and eye exams.  Stay current with your immunizations.  Do not use any tobacco products including cigarettes, chewing tobacco, or electronic  cigarettes.  If you are pregnant, do not drink alcohol.  If you are breastfeeding, limit how much and how often you drink alcohol.  Limit alcohol intake to no more than 1 drink per day for nonpregnant women. One drink equals 12 ounces of beer, 5 ounces of wine, or 1 ounces of hard liquor.  Do not use street drugs.  Do not share needles.  Ask your health care provider for help if you need support or information about quitting drugs.  Tell your health care provider if you often feel depressed.  Tell your health care provider if you have ever been abused or do not feel safe at home. This information is not intended to replace advice given to you by your health care provider. Make sure you discuss any questions you have with your health care provider. Document Released: 02/22/2011 Document Revised: 01/15/2016 Document Reviewed: 05/13/2015 Elsevier Interactive Patient Education  [employer].

## 2017-08-30 NOTE — Progress Notes (Signed)
Subjective:    Patient ID: Madison Wright, female    DOB: 05-03-1946, 72 y.o.   MRN: [id number]  HPI She is here for a physical exam.    Prediabetes:  She is compliant with a low sugar/carbohydrate diet.  She is very active at work twice a week and walks in the summer.    Hyperlipidemia: She is not on any medication. She is fairly compliant with a low fat/cholesterol diet. She is not exercising regularly.   She has no concerns.  She did have hernia surgery in the past year, but there are no other changes in her history.   Medications and allergies reviewed with patient and updated if appropriate.  Patient Active Problem List   Diagnosis Date Noted  . Suprapubic distress, right 06/02/2016  . Urinary frequency 06/02/2016  . Prediabetes 11/20/2015  . Vitamin D deficiency 10/21/2015  . Elevated blood pressure (not hypertension) 10/21/2015  . Diverticulosis of large intestine 01/07/2010  . Osteoporosis 01/07/2010  . Hyperlipidemia 03/06/2008  . GRANULOMA ANNULARE 03/06/2008    Current Outpatient Medications on File Prior to Visit  Medication Sig Dispense Refill  . Multiple Vitamin (MULTIVITAMIN) tablet Take 1 tablet by mouth daily.       No current facility-administered medications on file prior to visit.     Past Medical History:  Diagnosis Date  . Diverticulosis of colon   . Granuloma annulare 2008   Dr Amy Martinique  . Menopausal state 11/2000   Took HRT X 10 days only  in 05/2001 secondary to [facility] breast ca  . Osteoporosis   . Post-menopausal atrophic vaginitis 07/2005  . Vitamin D deficiency     Past Surgical History:  Procedure Laterality Date  . COLONOSCOPY  2004  & 2014   diverticulosis, Dr Olevia Perches  . SKIN CANCER EXCISION  2003   Basal skin  . WRIST FRACTURE SURGERY  2012   [facility]    Social History   Socioeconomic History  . Marital status: Married    Spouse name: Bijal Siglin  . Number of children: 0  . Years of education: None  . Highest education  level: None  Social Needs  . Financial resource strain: None  . Food insecurity - worry: None  . Food insecurity - inability: None  . Transportation needs - medical: None  . Transportation needs - non-medical: None  Occupational History  . Occupation: Research scientist (physical sciences): [employer]   Tobacco Use  . Smoking status: Never Smoker  . Smokeless tobacco: Never Used  Substance and Sexual Activity  . Alcohol use: No    Alcohol/week: 0.0 oz  . Drug use: No  . Sexual activity: Not Currently    Partners: Male    Birth control/protection: Post-menopausal  Other Topics Concern  . None  Social History Narrative   exercises    Family History  Problem Relation Age of Onset  . Arthritis Mother   . Osteoporosis Mother        died after fall with fx. hips and PE  . Stroke Mother        post immobilization with vertebral fracture  . Migraines Mother   . COPD Father   . Colon cancer Paternal Aunt 6  . Heart disease Paternal Aunt        X 2; both had MI > 50  . Diabetes Paternal Aunt   . Heart failure Paternal Grandmother   . Cancer - Colon Unknown  nephew - chemo    Review of Systems  Constitutional: Negative for chills and fever.  HENT: Negative for hearing loss.   Eyes: Negative for visual disturbance.  Respiratory: Negative for cough, shortness of breath and wheezing.   Cardiovascular: Negative for chest pain, palpitations and leg swelling.  Gastrointestinal: Negative for abdominal pain, blood in stool, constipation, diarrhea and nausea.  Genitourinary: Negative for dysuria and hematuria.  Musculoskeletal: Negative for arthralgias and back pain.  Skin: Negative for color change and rash.  Neurological: Negative for light-headedness and headaches.  Psychiatric/Behavioral: Negative for dysphoric mood. The patient is not nervous/anxious.        Objective:   Vitals:   08/31/17 0748  BP: (!) 152/94  Pulse: 74  Resp: 16  Temp: 97.8 F (36.6 C)  SpO2:  98%   Wt Readings from Last 3 Encounters:  08/31/17 148 lb (67.1 kg)  07/27/17 147 lb (66.7 kg)  11/17/16 143 lb (64.9 kg)   Body mass index is 26.22 kg/m.   Physical Exam    Constitutional: She appears well-developed and well-nourished. No distress.  HENT:  Head: Normocephalic and atraumatic.  Right Ear: External ear normal. Normal ear canal and TM Left Ear: External ear normal.  Normal ear canal and TM Mouth/Throat: Oropharynx is clear and moist.  Eyes: Conjunctivae and EOM are normal.  Neck: Neck supple. No tracheal deviation present. No thyromegaly present.  No carotid bruit  Cardiovascular: Normal rate, regular rhythm and normal heart sounds.   No murmur heard.  No edema. Pulmonary/Chest: Effort normal and breath sounds normal. No respiratory distress. She has no wheezes. She has no rales.  Breast: deferred to Gyn Abdominal: Soft. She exhibits no distension. There is no tenderness.  Lymphadenopathy: She has no cervical adenopathy.  Skin: Skin is warm and dry. She is not diaphoretic.  Psychiatric: She has a normal mood and affect. Her behavior is normal.       Assessment & Plan:   Physical exam: Screening blood work ordered Immunizations  Discussed shingrix, others up to date Colonoscopy  Up to date  Mammogram   Up to date  Gyn  Up to date  Dexa   Just had it done Eye exams  Up to date  Exercise    Not regularly - encouraged more regular exercise Weight   Good for age Skin  No concerns Substance abuse  none  See Problem List for Assessment and Plan of chronic medical problems.  FU in one year    See Problem List for Assessment and Plan of chronic medical problems.

## 2017-08-31 ENCOUNTER — Encounter: Payer: Self-pay | Admitting: Internal Medicine

## 2017-08-31 ENCOUNTER — Ambulatory Visit (INDEPENDENT_AMBULATORY_CARE_PROVIDER_SITE_OTHER): Payer: PPO | Admitting: Internal Medicine

## 2017-08-31 ENCOUNTER — Other Ambulatory Visit (INDEPENDENT_AMBULATORY_CARE_PROVIDER_SITE_OTHER): Payer: PPO

## 2017-08-31 VITALS — BP 152/94 | HR 74 | Temp 97.8°F | Resp 16 | Wt 148.0 lb

## 2017-08-31 DIAGNOSIS — Z0000 Encounter for general adult medical examination without abnormal findings: Secondary | ICD-10-CM | POA: Diagnosis not present

## 2017-08-31 DIAGNOSIS — E785 Hyperlipidemia, unspecified: Secondary | ICD-10-CM

## 2017-08-31 DIAGNOSIS — M818 Other osteoporosis without current pathological fracture: Secondary | ICD-10-CM

## 2017-08-31 DIAGNOSIS — R7303 Prediabetes: Secondary | ICD-10-CM | POA: Diagnosis not present

## 2017-08-31 DIAGNOSIS — E559 Vitamin D deficiency, unspecified: Secondary | ICD-10-CM

## 2017-08-31 LAB — CBC WITH DIFFERENTIAL/PLATELET
BASOS PCT: 1.3 % (ref 0.0–3.0)
Basophils Absolute: 0.1 10*3/uL (ref 0.0–0.1)
EOS PCT: 0.9 % (ref 0.0–5.0)
Eosinophils Absolute: 0.1 10*3/uL (ref 0.0–0.7)
HEMATOCRIT: 42.4 % (ref 36.0–46.0)
Hemoglobin: 13.9 g/dL (ref 12.0–15.0)
LYMPHS ABS: 1.4 10*3/uL (ref 0.7–4.0)
LYMPHS PCT: 17.8 % (ref 12.0–46.0)
MCHC: 32.8 g/dL (ref 30.0–36.0)
MCV: 86.3 fl (ref 78.0–100.0)
MONOS PCT: 6 % (ref 3.0–12.0)
Monocytes Absolute: 0.5 10*3/uL (ref 0.1–1.0)
NEUTROS ABS: 5.9 10*3/uL (ref 1.4–7.7)
Neutrophils Relative %: 74 % (ref 43.0–77.0)
PLATELETS: 354 10*3/uL (ref 150.0–400.0)
RBC: 4.91 Mil/uL (ref 3.87–5.11)
RDW: 14.7 % (ref 11.5–15.5)
WBC: 8 10*3/uL (ref 4.0–10.5)

## 2017-08-31 LAB — COMPREHENSIVE METABOLIC PANEL
ALT: 14 U/L (ref 0–35)
AST: 23 U/L (ref 0–37)
Albumin: 4.4 g/dL (ref 3.5–5.2)
Alkaline Phosphatase: 84 U/L (ref 39–117)
BILIRUBIN TOTAL: 0.6 mg/dL (ref 0.2–1.2)
BUN: 10 mg/dL (ref 6–23)
CALCIUM: 9.4 mg/dL (ref 8.4–10.5)
CHLORIDE: 100 meq/L (ref 96–112)
CO2: 30 meq/L (ref 19–32)
Creatinine, Ser: 0.54 mg/dL (ref 0.40–1.20)
GFR: 118.74 mL/min (ref >60.00)
Glucose, Bld: 112 mg/dL — ABNORMAL HIGH (ref 70–99)
POTASSIUM: 4.1 meq/L (ref 3.5–5.1)
Sodium: 138 mEq/L (ref 135–145)
Total Protein: 7.1 g/dL (ref 6.0–8.3)

## 2017-08-31 LAB — HEMOGLOBIN A1C: Hgb A1c MFr Bld: 6 % (ref 4.6–6.5)

## 2017-08-31 LAB — LIPID PANEL
CHOLESTEROL: 235 mg/dL — AB (ref 0–200)
HDL: 81.2 mg/dL (ref >39.00)
LDL CALC: 139 mg/dL — AB (ref 0–99)
NonHDL: 153.72
TRIGLYCERIDES: 76 mg/dL (ref 0.0–149.0)
Total CHOL/HDL Ratio: 3
VLDL: 15.2 mg/dL (ref 0.0–40.0)

## 2017-08-31 LAB — TSH: TSH: 1.15 u[IU]/mL (ref 0.35–4.50)

## 2017-08-31 LAB — VITAMIN D 25 HYDROXY (VIT D DEFICIENCY, FRACTURES): VITD: 27.98 ng/mL — ABNORMAL LOW (ref 30.00–100.00)

## 2017-08-31 NOTE — Assessment & Plan Note (Signed)
Not on medication Check lipid panel, cmp, tsh Regular exercise and healthy diet encouraged  

## 2017-08-31 NOTE — Assessment & Plan Note (Signed)
dexa just done - at [facility] - results went to gyn Taking a MVI Active, encouraged regular walking

## 2017-08-31 NOTE — Assessment & Plan Note (Signed)
Check a1c Low sugar / carb diet Stressed regular exercise   

## 2017-08-31 NOTE — Assessment & Plan Note (Signed)
Given OP will check vitamin d level since she has been deficient in the past

## 2017-09-06 ENCOUNTER — Telehealth: Payer: Self-pay | Admitting: Internal Medicine

## 2017-09-06 ENCOUNTER — Encounter: Payer: Self-pay | Admitting: Emergency Medicine

## 2017-09-06 NOTE — Telephone Encounter (Signed)
Spoke with pt to discuss lab results.

## 2017-09-06 NOTE — Telephone Encounter (Signed)
Patient requesting call back. °

## 2017-09-10 ENCOUNTER — Telehealth: Payer: Self-pay | Admitting: Internal Medicine

## 2017-09-10 NOTE — Telephone Encounter (Signed)
Let her know her bone density shows osteopenia - there is improvement in the right hip and the left hip and spine are stable.  She should continue doing what she is doing and we will recheck this in 2 years

## 2017-09-12 NOTE — Telephone Encounter (Signed)
LVM informing pt

## 2017-09-14 ENCOUNTER — Encounter: Payer: Self-pay | Admitting: Internal Medicine

## 2017-11-17 IMAGING — DX DG CHEST 2V
2 series · 2 of 2 positions shown · non-contrast
Comparison: None in PACs

CLINICAL DATA: Six weeks of cough and chest congestion with
increasing symptoms, history of bronchitis, nonsmoker.

EXAM:
CHEST  2 VIEW

[chest pa]
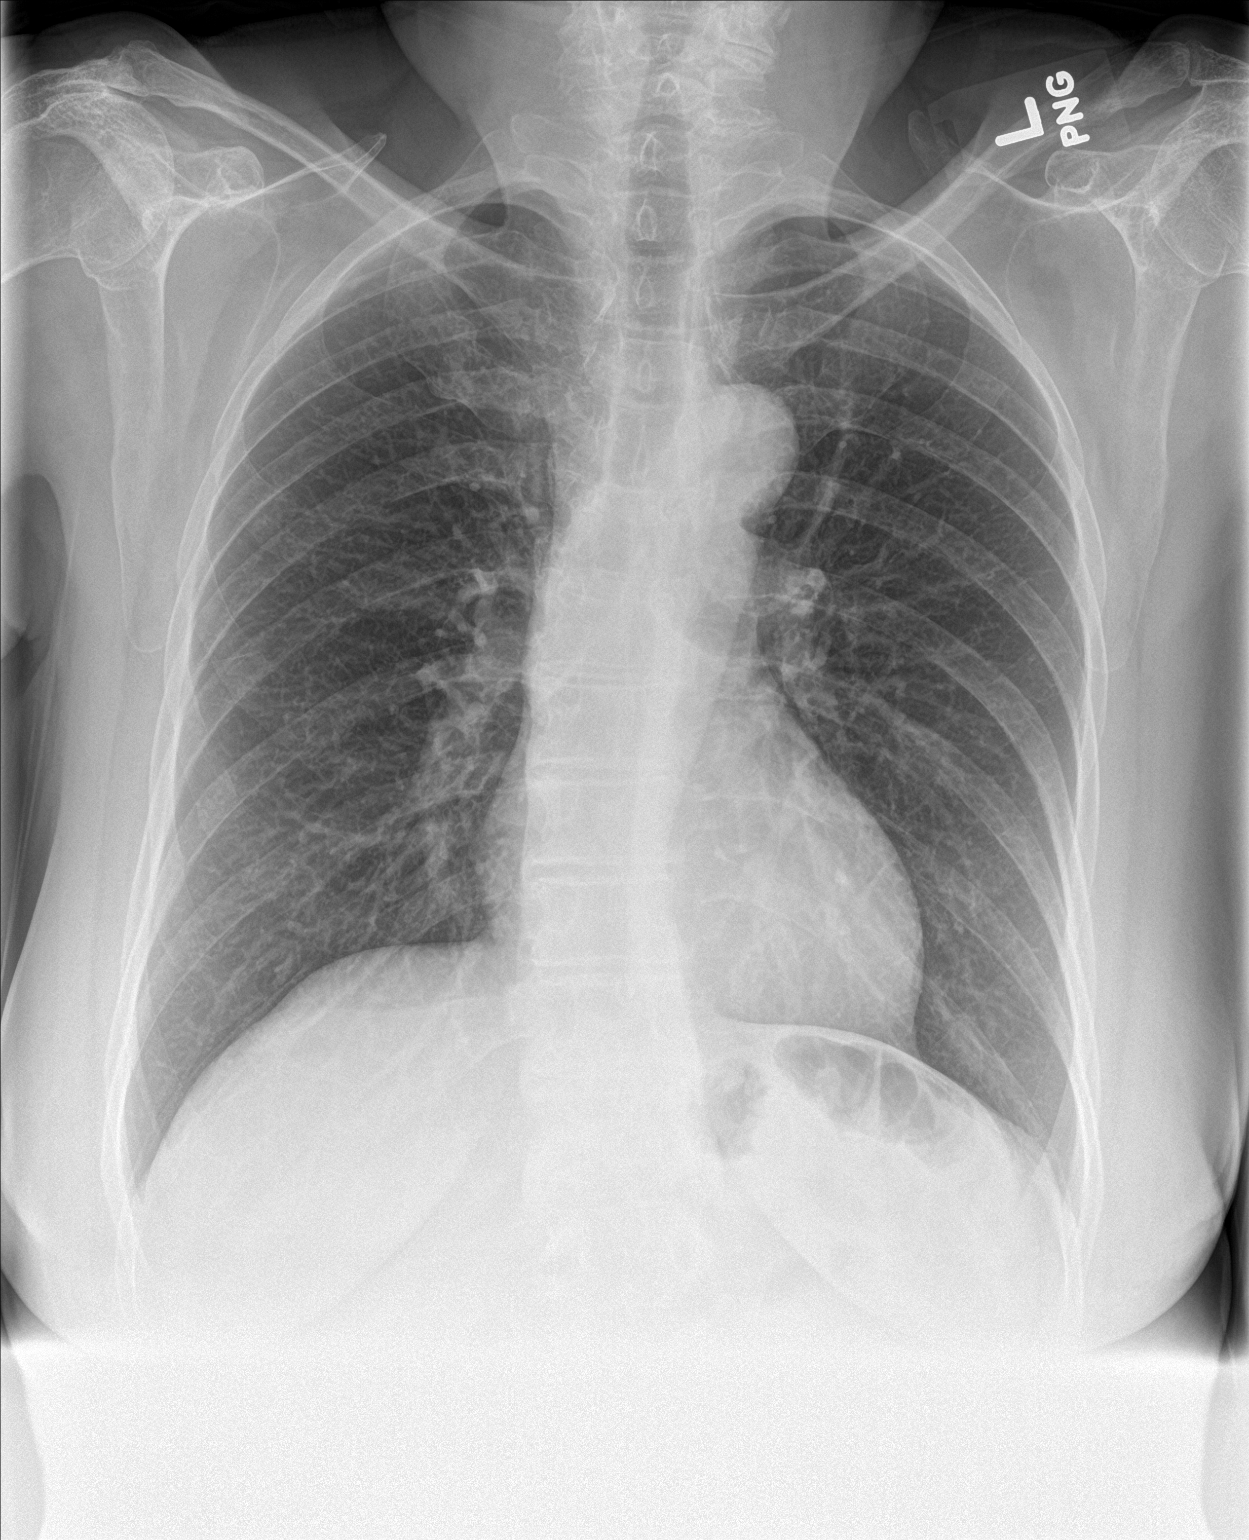

[chest lat]
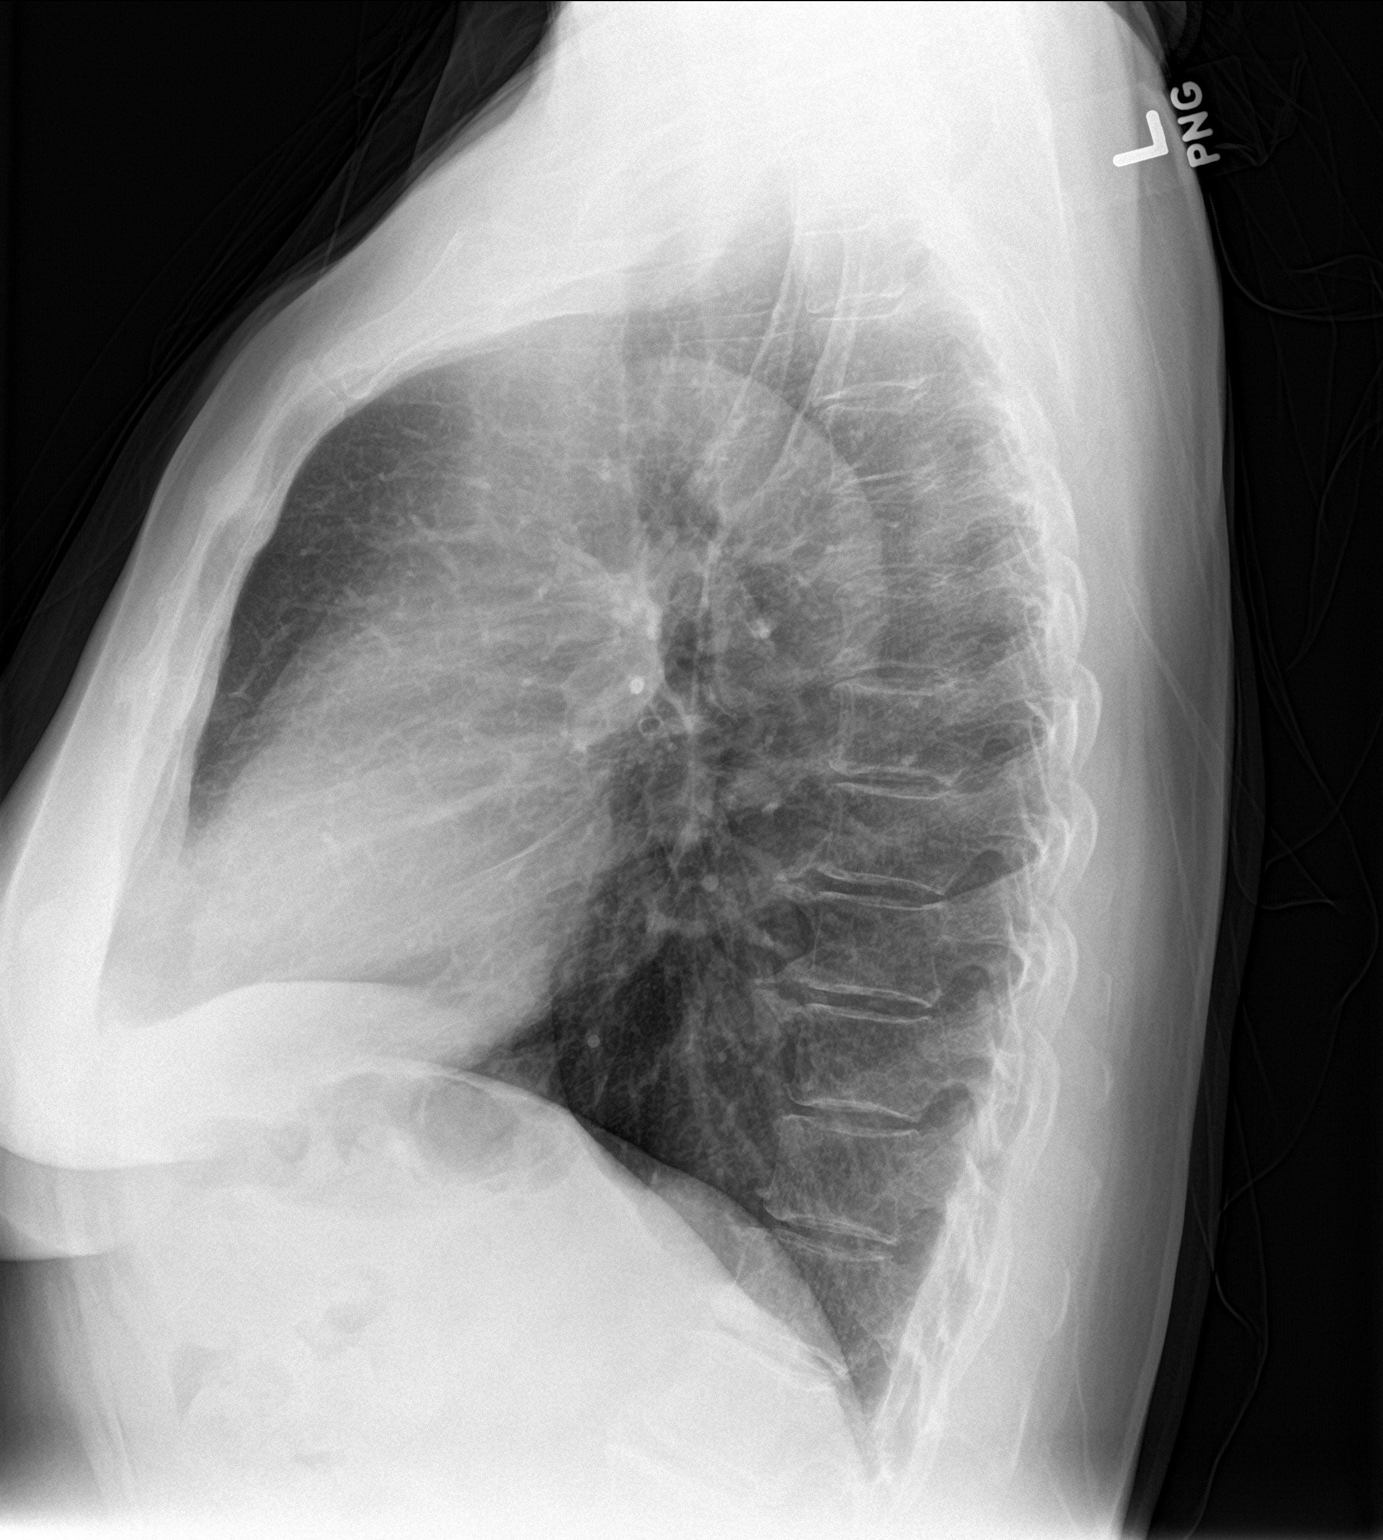

[2 of 2 positions shown; findings below may reference images not displayed]

FINDINGS: The lungs are adequately inflated. There is no focal infiltrate.
There is no pleural effusion. The heart and pulmonary vascularity
are normal. The mediastinum is normal in width. There is tortuosity
of the descending thoracic aorta. There is gentle dextrocurvature
centered in the mid thoracic spine.
IMPRESSION: There is no evidence of pneumonia nor other active cardiopulmonary
disease.

## 2017-12-07 ENCOUNTER — Ambulatory Visit: Payer: PPO

## 2018-01-11 ENCOUNTER — Ambulatory Visit: Payer: PPO

## 2018-06-06 DIAGNOSIS — N390 Urinary tract infection, site not specified: Secondary | ICD-10-CM | POA: Diagnosis not present

## 2018-06-06 DIAGNOSIS — N760 Acute vaginitis: Secondary | ICD-10-CM | POA: Diagnosis not present

## 2018-06-06 DIAGNOSIS — R300 Dysuria: Secondary | ICD-10-CM | POA: Diagnosis not present

## 2018-06-15 NOTE — Progress Notes (Deleted)
Subjective:    Patient ID: Madison Wright, female    DOB: 1945/12/20, 72 y.o.   MRN: [id number]  HPI The patient is here for an acute visit.     Medications and allergies reviewed with patient and updated if appropriate.  Patient Active Problem List   Diagnosis Date Noted  . Suprapubic distress, right 06/02/2016  . Urinary frequency 06/02/2016  . Prediabetes 11/20/2015  . Vitamin D deficiency 10/21/2015  . Elevated blood pressure reading without diagnosis of hypertension 10/21/2015  . Diverticulosis of large intestine 01/07/2010  . Osteoporosis 01/07/2010  . Hyperlipidemia 03/06/2008  . GRANULOMA ANNULARE 03/06/2008    Current Outpatient Medications on File Prior to Visit  Medication Sig Dispense Refill  . Multiple Vitamin (MULTIVITAMIN) tablet Take 1 tablet by mouth daily.       No current facility-administered medications on file prior to visit.     Past Medical History:  Diagnosis Date  . Diverticulosis of colon   . Granuloma annulare 2008   Dr Amy Martinique  . Menopausal state 11/2000   Took HRT X 10 days only  in 05/2001 secondary to [facility] breast ca  . Osteoporosis   . Post-menopausal atrophic vaginitis 07/2005  . Vitamin D deficiency     Past Surgical History:  Procedure Laterality Date  . COLONOSCOPY  2004  & 2014   diverticulosis, Dr Olevia Perches  . SKIN CANCER EXCISION  2003   Basal skin  . WRIST FRACTURE SURGERY  2012   [facility]    Social History   Socioeconomic History  . Marital status: Married    Spouse name: Dalila Arca  . Number of children: 0  . Years of education: Not on file  . Highest education level: Not on file  Occupational History  . Occupation: Research scientist (physical sciences): [employer]  . Financial resource strain: Not on file  . Food insecurity:    Worry: Not on file    Inability: Not on file  . Transportation needs:    Medical: Not on file    Non-medical: Not on file  Tobacco Use  . Smoking status: Never  Smoker  . Smokeless tobacco: Never Used  Substance and Sexual Activity  . Alcohol use: No    Alcohol/week: 0.0 standard drinks  . Drug use: No  . Sexual activity: Not Currently    Partners: Male    Birth control/protection: Post-menopausal  Lifestyle  . Physical activity:    Days per week: Not on file    Minutes per session: Not on file  . Stress: Not on file  Relationships  . Social connections:    Talks on phone: Not on file    Gets together: Not on file    Attends religious service: Not on file    Active member of club or organization: Not on file    Attends meetings of clubs or organizations: Not on file    Relationship status: Not on file  Other Topics Concern  . Not on file  Social History Narrative   exercises    Family History  Problem Relation Age of Onset  . Arthritis Mother   . Osteoporosis Mother        died after fall with fx. hips and PE  . Stroke Mother        post immobilization with vertebral fracture  . Migraines Mother   . COPD Father   . Colon cancer Paternal Aunt 23  .  Heart disease Paternal Aunt        X 2; both had MI > 69  . Diabetes Paternal Aunt   . Heart failure Paternal Grandmother   . Cancer - Colon Unknown        nephew - chemo    Review of Systems     Objective:  There were no vitals filed for this visit. BP Readings from Last 3 Encounters:  08/31/17 (!) 152/94  07/27/17 140/70  11/17/16 130/82   Wt Readings from Last 3 Encounters:  08/31/17 148 lb (67.1 kg)  07/27/17 147 lb (66.7 kg)  11/17/16 143 lb (64.9 kg)   There is no height or weight on file to calculate BMI.   Physical Exam         Assessment & Plan:    See Problem List for Assessment and Plan of chronic medical problems.

## 2018-06-16 ENCOUNTER — Ambulatory Visit: Payer: PPO | Admitting: Internal Medicine

## 2018-06-16 ENCOUNTER — Ambulatory Visit (INDEPENDENT_AMBULATORY_CARE_PROVIDER_SITE_OTHER): Payer: PPO | Admitting: Internal Medicine

## 2018-06-16 ENCOUNTER — Other Ambulatory Visit: Payer: PPO

## 2018-06-16 ENCOUNTER — Encounter: Payer: Self-pay | Admitting: Internal Medicine

## 2018-06-16 VITALS — BP 144/92 | HR 66 | Temp 98.1°F | Resp 16 | Ht 63.0 in | Wt 146.0 lb

## 2018-06-16 DIAGNOSIS — R300 Dysuria: Secondary | ICD-10-CM | POA: Diagnosis not present

## 2018-06-16 DIAGNOSIS — Z23 Encounter for immunization: Secondary | ICD-10-CM | POA: Diagnosis not present

## 2018-06-16 DIAGNOSIS — N3000 Acute cystitis without hematuria: Secondary | ICD-10-CM | POA: Diagnosis not present

## 2018-06-16 LAB — POCT URINALYSIS DIPSTICK
Bilirubin, UA: NEGATIVE
Blood, UA: NEGATIVE
Glucose, UA: NEGATIVE
Ketones, UA: NEGATIVE
Nitrite, UA: NEGATIVE
PH UA: 6.5 (ref 5.0–8.0)
Protein, UA: NEGATIVE
Spec Grav, UA: 1.01 (ref 1.010–1.025)
UROBILINOGEN UA: NEGATIVE U/dL — AB

## 2018-06-16 MED ORDER — PHENAZOPYRIDINE HCL 200 MG PO TABS: 200.0000 mg | ORAL_TABLET | Freq: Three times a day (TID) | ORAL | 0 refills | Status: DC | PRN

## 2018-06-16 MED ORDER — CEPHALEXIN 500 MG PO CAPS: 500.0000 mg | ORAL_CAPSULE | Freq: Two times a day (BID) | ORAL | 0 refills | Status: DC

## 2018-06-16 NOTE — Patient Instructions (Addendum)
Take the antibiotic as prescribed.  Take tylenol if needed.  Increase your water intake.    If the pyridium is not covered - you can take AZO over the counter.     Urinary Tract Infection, Adult A urinary tract infection (UTI) is an infection of any part of the urinary tract, which includes the kidneys, ureters, bladder, and urethra. These organs make, store, and get rid of urine in the body. UTI can be a bladder infection (cystitis) or kidney infection (pyelonephritis). What are the causes? This infection may be caused by fungi, viruses, or bacteria. Bacteria are the most common cause of UTIs. This condition can also be caused by repeated incomplete emptying of the bladder during urination. What increases the risk? This condition is more likely to develop if:  You ignore your need to urinate or hold urine for long periods of time.  You do not empty your bladder completely during urination.  You wipe back to front after urinating or having a bowel movement, if you are female.  You are uncircumcised, if you are female.  You are constipated.  You have a urinary catheter that stays in place (indwelling).  You have a weak defense (immune) system.  You have a medical condition that affects your bowels, kidneys, or bladder.  You have diabetes.  You take antibiotic medicines frequently or for long periods of time, and the antibiotics no longer work well against certain types of infections (antibiotic resistance).  You take medicines that irritate your urinary tract.  You are exposed to chemicals that irritate your urinary tract.  You are female.  What are the signs or symptoms? Symptoms of this condition include:  Fever.  Frequent urination or passing small amounts of urine frequently.  Needing to urinate urgently.  Pain or burning with urination.  Urine that smells bad or unusual.  Cloudy urine.  Pain in the lower abdomen or back.  Trouble urinating.  Blood in the  urine.  Vomiting or being less hungry than normal.  Diarrhea or abdominal pain.  Vaginal discharge, if you are female.  How is this diagnosed? This condition is diagnosed with a medical history and physical exam. You will also need to provide a urine sample to test your urine. Other tests may be done, including:  Blood tests.  Sexually transmitted disease (STD) testing.  If you have had more than one UTI, a cystoscopy or imaging studies may be done to determine the cause of the infections. How is this treated? Treatment for this condition often includes a combination of two or more of the following:  Antibiotic medicine.  Other medicines to treat less common causes of UTI.  Over-the-counter medicines to treat pain.  Drinking enough water to stay hydrated.  Follow these instructions at home:  Take over-the-counter and prescription medicines only as told by your health care provider.  If you were prescribed an antibiotic, take it as told by your health care provider. Do not stop taking the antibiotic even if you start to feel better.  Avoid alcohol, caffeine, tea, and carbonated beverages. They can irritate your bladder.  Drink enough fluid to keep your urine clear or pale yellow.  Keep all follow-up visits as told by your health care provider. This is important.  Make sure to: ? Empty your bladder often and completely. Do not hold urine for long periods of time. ? Empty your bladder before and after sex. ? Wipe from front to back after a bowel movement if you are  female. Use each tissue one time when you wipe. Contact a health care provider if:  You have back pain.  You have a fever.  You feel nauseous or vomit.  Your symptoms do not get better after 3 days.  Your symptoms go away and then return. Get help right away if:  You have severe back pain or lower abdominal pain.  You are vomiting and cannot keep down any medicines or water. This information is not  intended to replace advice given to you by your health care provider. Make sure you discuss any questions you have with your health care provider. Document Released: 05/19/2005 Document Revised: 01/21/2016 Document Reviewed: 06/30/2015 Elsevier Interactive Patient Education  [employer].

## 2018-06-16 NOTE — Progress Notes (Signed)
Subjective:    Patient ID: Madison Wright, female    DOB: 12/29/1945, 72 y.o.   MRN: [id number]  HPI The patient is here for an acute visit.   Her symtpoms started over a week ago.  She has dysuria and some mild urinary frequency.  She denies any urinary urgency, hematuria or odor to her urine.  She has not had any fevers, chills, nausea, abdominal pain, back pain, headaches or lightheadedness.  She did go to urgent care last week because of the symptoms and she was diagnosed with UTI.  They prescribed her Cipro 500 mg twice daily x10 days.  She has 1 day left.  She has not had any improvement.   Medications and allergies reviewed with patient and updated if appropriate.  Patient Active Problem List   Diagnosis Date Noted  . Suprapubic distress, right 06/02/2016  . Urinary frequency 06/02/2016  . Prediabetes 11/20/2015  . Vitamin D deficiency 10/21/2015  . Elevated blood pressure reading without diagnosis of hypertension 10/21/2015  . Diverticulosis of large intestine 01/07/2010  . Osteoporosis 01/07/2010  . Hyperlipidemia 03/06/2008  . GRANULOMA ANNULARE 03/06/2008    Current Outpatient Medications on File Prior to Visit  Medication Sig Dispense Refill  . Multiple Vitamin (MULTIVITAMIN) tablet Take 1 tablet by mouth daily.       No current facility-administered medications on file prior to visit.     Past Medical History:  Diagnosis Date  . Diverticulosis of colon   . Granuloma annulare 2008   Dr Amy Martinique  . Menopausal state 11/2000   Took HRT X 10 days only  in 05/2001 secondary to [facility] breast ca  . Osteoporosis   . Post-menopausal atrophic vaginitis 07/2005  . Vitamin D deficiency     Past Surgical History:  Procedure Laterality Date  . COLONOSCOPY  2004  & 2014   diverticulosis, Dr Olevia Perches  . SKIN CANCER EXCISION  2003   Basal skin  . WRIST FRACTURE SURGERY  2012   [facility]    Social History   Socioeconomic History  . Marital status: Married    Spouse  name: Kyelle Urbas  . Number of children: 0  . Years of education: Not on file  . Highest education level: Not on file  Occupational History  . Occupation: Research scientist (physical sciences): [employer]  . Financial resource strain: Not on file  . Food insecurity:    Worry: Not on file    Inability: Not on file  . Transportation needs:    Medical: Not on file    Non-medical: Not on file  Tobacco Use  . Smoking status: Never Smoker  . Smokeless tobacco: Never Used  Substance and Sexual Activity  . Alcohol use: No    Alcohol/week: 0.0 standard drinks  . Drug use: No  . Sexual activity: Not Currently    Partners: Male    Birth control/protection: Post-menopausal  Lifestyle  . Physical activity:    Days per week: Not on file    Minutes per session: Not on file  . Stress: Not on file  Relationships  . Social connections:    Talks on phone: Not on file    Gets together: Not on file    Attends religious service: Not on file    Active member of club or organization: Not on file    Attends meetings of clubs or organizations: Not on file    Relationship status: Not on  file  Other Topics Concern  . Not on file  Social History Narrative   exercises    Family History  Problem Relation Age of Onset  . Arthritis Mother   . Osteoporosis Mother        died after fall with fx. hips and PE  . Stroke Mother        post immobilization with vertebral fracture  . Migraines Mother   . COPD Father   . Colon cancer Paternal Aunt 5  . Heart disease Paternal Aunt        X 2; both had MI > 25  . Diabetes Paternal Aunt   . Heart failure Paternal Grandmother   . Cancer - Colon Unknown        nephew - chemo    Review of Systems  Constitutional: Negative for chills and fever.  Gastrointestinal: Negative for abdominal pain and nausea.  Genitourinary: Positive for dysuria and frequency. Negative for hematuria.  Musculoskeletal: Negative for back pain.  Neurological:  Negative for light-headedness and headaches.       Objective:   Vitals:   06/16/18 0907  BP: (!) 144/92  Pulse: 66  Resp: 16  Temp: 98.1 F (36.7 C)  SpO2: 98%   BP Readings from Last 3 Encounters:  06/16/18 (!) 144/92  08/31/17 (!) 152/94  07/27/17 140/70   Wt Readings from Last 3 Encounters:  06/16/18 146 lb (66.2 kg)  08/31/17 148 lb (67.1 kg)  07/27/17 147 lb (66.7 kg)   Body mass index is 25.86 kg/m.   Physical Exam  Constitutional: She appears well-developed and well-nourished. No distress.  Abdominal: Soft. She exhibits no distension. There is no tenderness.  Genitourinary:  Genitourinary Comments: No CVA tenderness  Skin: Skin is warm and dry. She is not diaphoretic.           Assessment & Plan:    See Problem List for Assessment and Plan of chronic medical problems.

## 2018-06-16 NOTE — Assessment & Plan Note (Signed)
Urine dip here consistent with UTI, which she is currently on Cipro for, but that has not been effective and her symptoms have not improved Discontinue Cipro We will send urine for culture Start Keflex twice daily Pyridium 200 mg 3 times daily as needed-can take over-the-counter AZO if not covered Call if symptoms do not improve

## 2018-06-18 LAB — URINE CULTURE
MICRO NUMBER:: 91286484
RESULT: NO GROWTH
SPECIMEN QUALITY: ADEQUATE

## 2018-06-20 ENCOUNTER — Other Ambulatory Visit: Payer: PPO

## 2018-06-20 ENCOUNTER — Encounter: Payer: Self-pay | Admitting: Family

## 2018-06-20 ENCOUNTER — Other Ambulatory Visit: Payer: Self-pay | Admitting: Family

## 2018-06-20 ENCOUNTER — Ambulatory Visit (INDEPENDENT_AMBULATORY_CARE_PROVIDER_SITE_OTHER): Payer: PPO | Admitting: Family

## 2018-06-20 VITALS — BP 142/80 | HR 86 | Temp 98.4°F | Ht 63.0 in | Wt 145.1 lb

## 2018-06-20 DIAGNOSIS — N898 Other specified noninflammatory disorders of vagina: Secondary | ICD-10-CM | POA: Diagnosis not present

## 2018-06-20 LAB — WET PREP, GENITAL
BACTERIA: NONE SEEN — AB
CLUE CELLS WET PREP: NONE SEEN — AB
Trich, Wet Prep: NONE SEEN — AB
WBC, Wet Prep HPF POC: NONE SEEN — AB
Yeast Wet Prep HPF POC: NONE SEEN — AB

## 2018-06-20 MED ORDER — FLUCONAZOLE 150 MG PO TABS
150.0000 mg | ORAL_TABLET | Freq: Once | ORAL | 0 refills | Status: AC
Start: 2018-06-20 — End: 2018-06-20

## 2018-06-20 NOTE — Progress Notes (Signed)
Madison Wright is a 72 y.o. female with the following history as recorded in [facility]:  Patient Active Problem List   Diagnosis Date Noted  . Acute cystitis without hematuria 06/16/2018  . Suprapubic distress, right 06/02/2016  . Urinary frequency 06/02/2016  . Prediabetes 11/20/2015  . Vitamin D deficiency 10/21/2015  . Elevated blood pressure reading without diagnosis of hypertension 10/21/2015  . Diverticulosis of large intestine 01/07/2010  . Osteoporosis 01/07/2010  . Hyperlipidemia 03/06/2008  . GRANULOMA ANNULARE 03/06/2008    Current Outpatient Medications  Medication Sig Dispense Refill  . cephALEXin (KEFLEX) 500 MG capsule Take 1 capsule (500 mg total) by mouth 2 (two) times daily. 14 capsule 0  . Multiple Vitamin (MULTIVITAMIN) tablet Take 1 tablet by mouth daily.      . phenazopyridine (PYRIDIUM) 200 MG tablet Take 1 tablet (200 mg total) by mouth 3 (three) times daily as needed for pain. 10 tablet 0   No current facility-administered medications for this visit.     Allergies: Sulfonamide derivatives and Codeine  Past Medical History:  Diagnosis Date  . Diverticulosis of colon   . Granuloma annulare 2008   Dr Amy Martinique  . Menopausal state 11/2000   Took HRT X 10 days only  in 05/2001 secondary to [facility] breast ca  . Osteoporosis   . Post-menopausal atrophic vaginitis 07/2005  . Vitamin D deficiency     Past Surgical History:  Procedure Laterality Date  . COLONOSCOPY  2004  & 2014   diverticulosis, Dr Olevia Perches  . SKIN CANCER EXCISION  2003   Basal skin  . WRIST FRACTURE SURGERY  2012   [facility]    Family History  Problem Relation Age of Onset  . Arthritis Mother   . Osteoporosis Mother        died after fall with fx. hips and PE  . Stroke Mother        post immobilization with vertebral fracture  . Migraines Mother   . COPD Father   . Colon cancer Paternal Aunt 44  . Heart disease Paternal Aunt        X 2; both had MI > 4  . Diabetes Paternal Aunt   .  Heart failure Paternal Grandmother   . Cancer - Colon Unknown        nephew - chemo    Social History   Tobacco Use  . Smoking status: Never Smoker  . Smokeless tobacco: Never Used  Substance Use Topics  . Alcohol use: No    Alcohol/week: 0.0 standard drinks    Subjective:  Presents with concerns for vaginal "burning." No discharge/ not prone to yeast infections; seen at [facility] early last week and treated with Cipro for urinary discomfort; followed up with her PCP last Friday to re-check symptoms; no relief from Cipro and was changed to Keflex; urine culture done last Friday did not show any type of urinary tract infection.   Objective:  Vitals:   06/20/18 1315  BP: (!) 142/80  Pulse: 86  Temp: 98.4 F (36.9 C)  TempSrc: Oral  SpO2: 96%  Weight: 145 lb 1.9 oz (65.8 kg)  Height: 5\' 3"  (1.6 m)    General: Well developed, well nourished, in no acute distress  Skin : Warm and dry.  Head: Normocephalic and atraumatic  Lungs: Respirations unlabored; Extremities: No edema, cyanosis, clubbing  Vessels: Symmetric bilaterally  Neurologic: Alert and oriented; speech intact; face symmetrical; moves all extremities well; CNII-XII intact without focal deficit  Pelvic  exam- vaginal dryness noted; no obvious abnormality seen  Assessment:  1. Vaginal itching     Plan:  ? Vaginal infection vs BV due to recent antibiotic use; update wet prep today; follow-up to be determined. May also need to consider trial of topical estrogen and/or Replens if continues to have recurrent symptoms.    No follow-ups on file.  Orders Placed This Encounter  Procedures  . Wet prep, genital    Standing Status:   Future    Number of Occurrences:   1    Standing Expiration Date:   06/20/2019    Requested Prescriptions    No prescriptions requested or ordered in this encounter

## 2018-06-28 DIAGNOSIS — H2513 Age-related nuclear cataract, bilateral: Secondary | ICD-10-CM | POA: Diagnosis not present

## 2018-07-26 DIAGNOSIS — L821 Other seborrheic keratosis: Secondary | ICD-10-CM | POA: Diagnosis not present

## 2018-07-26 DIAGNOSIS — Z85828 Personal history of other malignant neoplasm of skin: Secondary | ICD-10-CM | POA: Diagnosis not present

## 2018-07-26 DIAGNOSIS — D2261 Melanocytic nevi of right upper limb, including shoulder: Secondary | ICD-10-CM | POA: Diagnosis not present

## 2018-07-26 DIAGNOSIS — D1801 Hemangioma of skin and subcutaneous tissue: Secondary | ICD-10-CM | POA: Diagnosis not present

## 2018-08-30 DIAGNOSIS — Z1231 Encounter for screening mammogram for malignant neoplasm of breast: Secondary | ICD-10-CM | POA: Diagnosis not present

## 2018-08-30 LAB — HM MAMMOGRAPHY

## 2018-09-07 ENCOUNTER — Encounter: Payer: Self-pay | Admitting: Internal Medicine

## 2018-09-07 NOTE — Progress Notes (Signed)
Abstracted and sent to scan  

## 2019-03-14 NOTE — Patient Instructions (Addendum)
Please monitor your BP at home for two weeks - call after two weeks and let me know your readings.   Your goal BP is 140/90 or less.   Consider getting a tetanus vaccine and shingles vaccine at the pharmacy.   Tests ordered today. Your results will be released to [place] (or called to you) after review.  If any changes need to be made, you will be notified at that same time.  All other Health Maintenance issues reviewed.   All recommended immunizations and age-appropriate screenings are up-to-date or discussed.  No immunization administered today.   Medications reviewed and updated.  Changes include :   none   Please followup in 1 year    Health Maintenance, Female Adopting a healthy lifestyle and getting preventive care are important in promoting health and wellness. Ask your health care provider about:  The right schedule for you to have regular tests and exams.  Things you can do on your own to prevent diseases and keep yourself healthy. What should I know about diet, weight, and exercise? Eat a healthy diet   Eat a diet that includes plenty of vegetables, fruits, low-fat dairy products, and lean protein.  Do not eat a lot of foods that are high in solid fats, added sugars, or sodium. Maintain a healthy weight Body mass index (BMI) is used to identify weight problems. It estimates body fat based on height and weight. Your health care provider can help determine your BMI and help you achieve or maintain a healthy weight. Get regular exercise Get regular exercise. This is one of the most important things you can do for your health. Most adults should:  Exercise for at least 150 minutes each week. The exercise should increase your heart rate and make you sweat (moderate-intensity exercise).  Do strengthening exercises at least twice a week. This is in addition to the moderate-intensity exercise.  Spend less time sitting. Even light physical activity can be beneficial. Watch  cholesterol and blood lipids Have your blood tested for lipids and cholesterol at 71 years of age, then have this test every 5 years. Have your cholesterol levels checked more often if:  Your lipid or cholesterol levels are high.  You are older than 71 years of age.  You are at high risk for heart disease. What should I know about cancer screening? Depending on your health history and family history, you may need to have cancer screening at various ages. This may include screening for:  Breast cancer.  Cervical cancer.  Colorectal cancer.  Skin cancer.  Lung cancer. What should I know about heart disease, diabetes, and high blood pressure? Blood pressure and heart disease  High blood pressure causes heart disease and increases the risk of stroke. This is more likely to develop in people who have high blood pressure readings, are of African descent, or are overweight.  Have your blood pressure checked: ? Every 3-5 years if you are 87-85 years of age. ? Every year if you are 36 years old or older. Diabetes Have regular diabetes screenings. This checks your fasting blood sugar level. Have the screening done:  Once every three years after age 35 if you are at a normal weight and have a low risk for diabetes.  More often and at a younger age if you are overweight or have a high risk for diabetes. What should I know about preventing infection? Hepatitis B If you have a higher risk for hepatitis B, you should be screened  for this virus. Talk with your health care provider to find out if you are at risk for hepatitis B infection. Hepatitis C Testing is recommended for:  Everyone born from [birth year] through 1963.  Anyone with known risk factors for hepatitis C. Sexually transmitted infections (STIs)  Get screened for STIs, including gonorrhea and chlamydia, if: ? You are sexually active and are younger than 71 years of age. ? You are older than 71 years of age and your health care  provider tells you that you are at risk for this type of infection. ? Your sexual activity has changed since you were last screened, and you are at increased risk for chlamydia or gonorrhea. Ask your health care provider if you are at risk.  Ask your health care provider about whether you are at high risk for HIV. Your health care provider may recommend a prescription medicine to help prevent HIV infection. If you choose to take medicine to prevent HIV, you should first get tested for HIV. You should then be tested every 3 months for as long as you are taking the medicine. Pregnancy  If you are about to stop having your period (premenopausal) and you may become pregnant, seek counseling before you get pregnant.  Take 400 to 800 micrograms (mcg) of folic acid every day if you become pregnant.  Ask for birth control (contraception) if you want to prevent pregnancy. Osteoporosis and menopause Osteoporosis is a disease in which the bones lose minerals and strength with aging. This can result in bone fractures. If you are 29 years old or older, or if you are at risk for osteoporosis and fractures, ask your health care provider if you should:  Be screened for bone loss.  Take a calcium or vitamin D supplement to lower your risk of fractures.  Be given hormone replacement therapy (HRT) to treat symptoms of menopause. Follow these instructions at home: Lifestyle  Do not use any products that contain nicotine or tobacco, such as cigarettes, e-cigarettes, and chewing tobacco. If you need help quitting, ask your health care provider.  Do not use street drugs.  Do not share needles.  Ask your health care provider for help if you need support or information about quitting drugs. Alcohol use  Do not drink alcohol if: ? Your health care provider tells you not to drink. ? You are pregnant, may be pregnant, or are planning to become pregnant.  If you drink alcohol: ? Limit how much you use to 0-1  drink a day. ? Limit intake if you are breastfeeding.  Be aware of how much alcohol is in your drink. In the [place] one drink equals one 12 oz bottle of beer (355 mL), one 5 oz glass of wine (148 mL), or one 1 oz glass of hard liquor (44 mL). General instructions  Schedule regular health, dental, and eye exams.  Stay current with your vaccines.  Tell your health care provider if: ? You often feel depressed. ? You have ever been abused or do not feel safe at home. Summary  Adopting a healthy lifestyle and getting preventive care are important in promoting health and wellness.  Follow your health care provider's instructions about healthy diet, exercising, and getting tested or screened for diseases.  Follow your health care provider's instructions on monitoring your cholesterol and blood pressure. This information is not intended to replace advice given to you by your health care provider. Make sure you discuss any questions you have with your health care  provider. Document Released: 02/22/2011 Document Revised: 08/02/2018 Document Reviewed: 08/02/2018 Elsevier Patient Education  2020 [employer].

## 2019-03-14 NOTE — Progress Notes (Signed)
Subjective:    Patient ID: Madison Wright, female    DOB: 02/29/1946, 73 y.o.   MRN: [id number]  HPI She is here for a physical exam.   She has no concerns.  She does not check her BP at home.     Medications and allergies reviewed with patient and updated if appropriate.  Patient Active Problem List   Diagnosis Date Noted  . Prediabetes 11/20/2015  . Vitamin D deficiency 10/21/2015  . Elevated blood pressure reading without diagnosis of hypertension 10/21/2015  . Diverticulosis of large intestine 01/07/2010  . Osteoporosis 01/07/2010  . Hyperlipidemia 03/06/2008  . GRANULOMA ANNULARE 03/06/2008    Current Outpatient Medications on File Prior to Visit  Medication Sig Dispense Refill  . Apoaequorin (PREVAGEN PO) Take by mouth.    . Multiple Vitamin (MULTIVITAMIN) tablet Take 1 tablet by mouth daily.      . Probiotic Product (ALIGN PO) Take by mouth.     No current facility-administered medications on file prior to visit.     Past Medical History:  Diagnosis Date  . Diverticulosis of colon   . Granuloma annulare 2008   Dr Amy Martinique  . Menopausal state 11/2000   Took HRT X 10 days only  in 05/2001 secondary to [facility] breast ca  . Osteoporosis   . Post-menopausal atrophic vaginitis 07/2005  . Vitamin D deficiency     Past Surgical History:  Procedure Laterality Date  . COLONOSCOPY  2004  & 2014   diverticulosis, Dr Olevia Perches  . SKIN CANCER EXCISION  2003   Basal skin  . WRIST FRACTURE SURGERY  2012   [facility]    Social History   Socioeconomic History  . Marital status: Married    Spouse name: Viridiana Spaid  . Number of children: 0  . Years of education: Not on file  . Highest education level: Not on file  Occupational History  . Occupation: Research scientist (physical sciences): [employer]  . Financial resource strain: Not on file  . Food insecurity    Worry: Not on file    Inability: Not on file  . Transportation needs    Medical: Not on  file    Non-medical: Not on file  Tobacco Use  . Smoking status: Never Smoker  . Smokeless tobacco: Never Used  Substance and Sexual Activity  . Alcohol use: No    Alcohol/week: 0.0 standard drinks  . Drug use: No  . Sexual activity: Not Currently    Partners: Male    Birth control/protection: Post-menopausal  Lifestyle  . Physical activity    Days per week: Not on file    Minutes per session: Not on file  . Stress: Not on file  Relationships  . Social Herbalist on phone: Not on file    Gets together: Not on file    Attends religious service: Not on file    Active member of club or organization: Not on file    Attends meetings of clubs or organizations: Not on file    Relationship status: Not on file  Other Topics Concern  . Not on file  Social History Narrative   exercises    Family History  Problem Relation Age of Onset  . Arthritis Mother   . Osteoporosis Mother        died after fall with fx. hips and PE  . Stroke Mother  post immobilization with vertebral fracture  . Migraines Mother   . COPD Father   . Colon cancer Paternal Aunt 42  . Heart disease Paternal Aunt        X 2; both had MI > 17  . Diabetes Paternal Aunt   . Heart failure Paternal Grandmother   . Cancer - Colon Unknown        nephew - chemo    Review of Systems  Constitutional: Negative for chills and fever.  Eyes: Negative for visual disturbance.  Respiratory: Negative for cough, shortness of breath and wheezing.   Cardiovascular: Negative for chest pain, palpitations and leg swelling.  Gastrointestinal: Negative for abdominal pain, blood in stool, constipation, diarrhea and nausea.       No gerd  Genitourinary: Negative for dysuria and hematuria.  Musculoskeletal: Negative for arthralgias and back pain.  Skin: Negative for color change and rash.  Neurological: Negative for light-headedness and headaches.  Psychiatric/Behavioral: Negative for dysphoric mood. The patient  is not nervous/anxious.        Objective:   Vitals:   03/15/19 0838  BP: (!) 164/92  Pulse: 64  Resp: 16  Temp: 98.4 F (36.9 C)  SpO2: 98%   Filed Weights   03/15/19 0838  Weight: 147 lb (66.7 kg)   Body mass index is 26.04 kg/m.  BP Readings from Last 3 Encounters:  03/15/19 (!) 164/92  06/20/18 (!) 142/80  06/16/18 (!) 144/92    Wt Readings from Last 3 Encounters:  03/15/19 147 lb (66.7 kg)  06/20/18 145 lb 1.9 oz (65.8 kg)  06/16/18 146 lb (66.2 kg)     Physical Exam Constitutional: She appears well-developed and well-nourished. No distress.  HENT:  Head: Normocephalic and atraumatic.  Right Ear: External ear normal. Normal ear canal and TM Left Ear: External ear normal.  Normal ear canal and TM Mouth/Throat: Oropharynx is clear and moist.  Eyes: Conjunctivae and EOM are normal.  Neck: Neck supple. No tracheal deviation present. No thyromegaly present.  No carotid bruit  Cardiovascular: Normal rate, regular rhythm and normal heart sounds.   No murmur heard.  No edema. Pulmonary/Chest: Effort normal and breath sounds normal. No respiratory distress. She has no wheezes. She has no rales.  Breast: deferred   Abdominal: Soft. She exhibits no distension. There is no tenderness.  Lymphadenopathy: She has no cervical adenopathy.  Skin: Skin is warm and dry. She is not diaphoretic.  Psychiatric: She has a normal mood and affect. Her behavior is normal.        Assessment & Plan:   Physical exam: Screening blood work ordered Immunizations   Discussed tdap, discussed shingrix, others up to date Colonoscopy   Up to date  Mammogram   Up to date  Gyn    Up to date  Dexa    Up to date  Eye exams  Up to date  Exercise  Yard work [employer]   Good for age Skin    No concerns Substance abuse   none  See Problem List for Assessment and Plan of chronic medical problems.   FU one year

## 2019-03-15 ENCOUNTER — Ambulatory Visit (INDEPENDENT_AMBULATORY_CARE_PROVIDER_SITE_OTHER): Payer: PPO | Admitting: Internal Medicine

## 2019-03-15 ENCOUNTER — Other Ambulatory Visit: Payer: Self-pay

## 2019-03-15 ENCOUNTER — Encounter: Payer: Self-pay | Admitting: Internal Medicine

## 2019-03-15 ENCOUNTER — Other Ambulatory Visit (INDEPENDENT_AMBULATORY_CARE_PROVIDER_SITE_OTHER): Payer: PPO

## 2019-03-15 VITALS — BP 164/92 | HR 64 | Temp 98.4°F | Resp 16 | Ht 63.0 in | Wt 147.0 lb

## 2019-03-15 DIAGNOSIS — Z0000 Encounter for general adult medical examination without abnormal findings: Secondary | ICD-10-CM | POA: Diagnosis not present

## 2019-03-15 DIAGNOSIS — R030 Elevated blood-pressure reading, without diagnosis of hypertension: Secondary | ICD-10-CM

## 2019-03-15 DIAGNOSIS — M818 Other osteoporosis without current pathological fracture: Secondary | ICD-10-CM

## 2019-03-15 DIAGNOSIS — R7303 Prediabetes: Secondary | ICD-10-CM | POA: Diagnosis not present

## 2019-03-15 DIAGNOSIS — E782 Mixed hyperlipidemia: Secondary | ICD-10-CM | POA: Diagnosis not present

## 2019-03-15 LAB — CBC WITH DIFFERENTIAL/PLATELET
Basophils Absolute: 0.1 10*3/uL (ref 0.0–0.1)
Basophils Relative: 1.2 % (ref 0.0–3.0)
Eosinophils Absolute: 0.1 10*3/uL (ref 0.0–0.7)
Eosinophils Relative: 0.8 % (ref 0.0–5.0)
HCT: 40.9 % (ref 36.0–46.0)
Hemoglobin: 13.3 g/dL (ref 12.0–15.0)
Lymphocytes Relative: 18.1 % (ref 12.0–46.0)
Lymphs Abs: 1.4 10*3/uL (ref 0.7–4.0)
MCHC: 32.5 g/dL (ref 30.0–36.0)
MCV: 86.7 fl (ref 78.0–100.0)
Monocytes Absolute: 0.6 10*3/uL (ref 0.1–1.0)
Monocytes Relative: 7.1 % (ref 3.0–12.0)
Neutro Abs: 5.8 10*3/uL (ref 1.4–7.7)
Neutrophils Relative %: 72.8 % (ref 43.0–77.0)
Platelets: 328 10*3/uL (ref 150.0–400.0)
RBC: 4.72 Mil/uL (ref 3.87–5.11)
RDW: 14.3 % (ref 11.5–15.5)
WBC: 8 10*3/uL (ref 4.0–10.5)

## 2019-03-15 LAB — COMPREHENSIVE METABOLIC PANEL
ALT: 16 U/L (ref 0–35)
AST: 30 U/L (ref 0–37)
Albumin: 4.4 g/dL (ref 3.5–5.2)
Alkaline Phosphatase: 90 U/L (ref 39–117)
BUN: 12 mg/dL (ref 6–23)
CO2: 27 mEq/L (ref 19–32)
Calcium: 9.5 mg/dL (ref 8.4–10.5)
Chloride: 103 mEq/L (ref 96–112)
Creatinine, Ser: 0.61 mg/dL (ref 0.40–1.20)
GFR: 96.63 mL/min (ref >60.00)
Glucose, Bld: 100 mg/dL — ABNORMAL HIGH (ref 70–99)
Potassium: 3.9 mEq/L (ref 3.5–5.1)
Sodium: 139 mEq/L (ref 135–145)
Total Bilirubin: 0.8 mg/dL (ref 0.2–1.2)
Total Protein: 7 g/dL (ref 6.0–8.3)

## 2019-03-15 LAB — LIPID PANEL
Cholesterol: 196 mg/dL (ref 0–200)
HDL: 70.6 mg/dL (ref >39.00)
LDL Cholesterol: 114 mg/dL — ABNORMAL HIGH (ref 0–99)
NonHDL: 125.51
Total CHOL/HDL Ratio: 3
Triglycerides: 58 mg/dL (ref 0.0–149.0)
VLDL: 11.6 mg/dL (ref 0.0–40.0)

## 2019-03-15 LAB — HEMOGLOBIN A1C: Hgb A1c MFr Bld: 6 % (ref 4.6–6.5)

## 2019-03-15 LAB — TSH: TSH: 0.7 u[IU]/mL (ref 0.35–4.50)

## 2019-03-15 LAB — VITAMIN D 25 HYDROXY (VIT D DEFICIENCY, FRACTURES): VITD: 51.01 ng/mL (ref 30.00–100.00)

## 2019-03-15 NOTE — Assessment & Plan Note (Signed)
Taking MVI most daily - encouraged her to take it daily Valla Leaver work is her exercise dexa up to date Will check vitamin d level

## 2019-03-15 NOTE — Assessment & Plan Note (Signed)
Check lipid panel, cmp, tsh  Controlled with diet Regular exercise and healthy diet encouraged

## 2019-03-15 NOTE — Assessment & Plan Note (Signed)
Check a1c Low sugar / carb diet Stressed regular exercise   

## 2019-03-15 NOTE — Assessment & Plan Note (Signed)
BP has been elevated here - asked her to monitor at home in the past and has only checked it rarely - it was normal Advised her to check daily for 2 weeks and to call with her readings If elevated need to start a low dose of a medication cmp

## 2019-05-18 ENCOUNTER — Ambulatory Visit (INDEPENDENT_AMBULATORY_CARE_PROVIDER_SITE_OTHER): Payer: PPO

## 2019-05-18 ENCOUNTER — Other Ambulatory Visit: Payer: Self-pay

## 2019-05-18 DIAGNOSIS — Z23 Encounter for immunization: Secondary | ICD-10-CM | POA: Diagnosis not present

## 2019-07-02 DIAGNOSIS — H2513 Age-related nuclear cataract, bilateral: Secondary | ICD-10-CM | POA: Diagnosis not present

## 2019-07-18 ENCOUNTER — Other Ambulatory Visit: Payer: Self-pay

## 2020-01-02 DIAGNOSIS — M8589 Other specified disorders of bone density and structure, multiple sites: Secondary | ICD-10-CM | POA: Diagnosis not present

## 2020-01-02 DIAGNOSIS — Z1231 Encounter for screening mammogram for malignant neoplasm of breast: Secondary | ICD-10-CM | POA: Diagnosis not present

## 2020-01-02 LAB — HM DEXA SCAN

## 2020-01-02 LAB — HM MAMMOGRAPHY

## 2020-01-10 ENCOUNTER — Telehealth: Payer: Self-pay | Admitting: Internal Medicine

## 2020-01-10 DIAGNOSIS — M818 Other osteoporosis without current pathological fracture: Secondary | ICD-10-CM

## 2020-01-10 NOTE — Telephone Encounter (Signed)
Her bone density shows severe osteopenia with a high risk of fracture.  She should consider going on medication to help improve her bones.  She can make an appt to discuss further.

## 2020-01-11 NOTE — Telephone Encounter (Signed)
LVM to call back in regards. 

## 2020-01-11 NOTE — Telephone Encounter (Signed)
New message: ° ° °Pt is returning a call for test results. Please advise. °

## 2020-01-14 ENCOUNTER — Telehealth: Payer: Self-pay

## 2020-01-14 NOTE — Telephone Encounter (Signed)
Spoke with pt in regards. Documented in another phone message.

## 2020-01-14 NOTE — Telephone Encounter (Signed)
New message   Returning call back to the CMA from today. 

## 2020-01-14 NOTE — Telephone Encounter (Signed)
Pt aware of results. Appointment scheduled for next Wednesday.

## 2020-01-16 ENCOUNTER — Encounter: Payer: Self-pay | Admitting: Internal Medicine

## 2020-01-22 NOTE — Progress Notes (Signed)
Subjective:    Patient ID: Madison Wright, female    DOB: 02/26/1946, 74 y.o.   MRN: [id number]  HPI The patient is here for follow up her osteoporosis.    12/2019:  RFN  -2.2,  LFN  -2.4, spine -1.2 - statistically significant inc in spine.  Hips stable.   frax  34%, 15%  08/24/17:   Osteopenia -- LFN  -2.3, RFN -2.0, spine -1.2  - improvement in RFN, stable spine and LFN   She is taking a MVI most days.  She does yard work, but no other exercise. 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       Medications and allergies reviewed with patient and updated if appropriate.  Patient Active Problem List   Diagnosis Date Noted  . Prediabetes 11/20/2015  . Vitamin D deficiency 10/21/2015  . Elevated blood pressure reading without diagnosis of hypertension 10/21/2015  . Diverticulosis of large intestine 01/07/2010  . Osteoporosis 01/07/2010  . Hyperlipidemia 03/06/2008  . GRANULOMA ANNULARE 03/06/2008    Current Outpatient Medications on File Prior to Visit  Medication Sig Dispense Refill  . Apoaequorin (PREVAGEN PO) Take by mouth.    . Multiple Vitamin (MULTIVITAMIN) tablet Take 1 tablet by mouth daily.      . Probiotic Product (ALIGN PO) Take by mouth.     No current facility-administered medications on file prior to visit.    Past Medical History:  Diagnosis Date  . Diverticulosis of colon   . Granuloma annulare 2008   Dr Amy Martinique  . Menopausal state 11/2000   Took HRT X 10 days only  in 05/2001 secondary to [facility] breast ca  . Osteoporosis   . Post-menopausal atrophic vaginitis 07/2005  . Vitamin D deficiency     Past Surgical History:  Procedure Laterality Date  . COLONOSCOPY  2004  & 2014   diverticulosis, Dr Olevia Perches  . SKIN CANCER EXCISION  2003   Basal skin  . WRIST FRACTURE SURGERY  2012   [facility]    Social History   Socioeconomic History  . Marital status: Married    Spouse name: Ardalia Neumeister  .  Number of children: 0  . Years of education: Not on file  . Highest education level: Not on file  Occupational History  . Occupation: Research scientist (physical sciences): [employer]   Tobacco Use  . Smoking status: Never Smoker  . Smokeless tobacco: Never Used  Substance and Sexual Activity  . Alcohol use: No    Alcohol/week: 0.0 standard drinks  . Drug use: No  . Sexual activity: Not Currently    Partners: Male    Birth control/protection: Post-menopausal  Other Topics Concern  . Not on file  Social History Narrative   exercises   Social Determinants of Health   Financial Resource Strain:   . Difficulty of Paying Living Expenses:   Food Insecurity:   . Worried About Charity fundraiser in the Last Year:   . Arboriculturist in the Last Year:   Transportation Needs:   . Film/video editor (Medical):   Marland Kitchen Lack of Transportation (Non-Medical):   Physical Activity:   . Days of Exercise per Week:   . Minutes of Exercise per Session:   Stress:   . Feeling of Stress :   Social Connections:   . Frequency of Communication with Friends and Family:   .  Frequency of Social Gatherings with Friends and Family:   . Attends Religious Services:   . Active Member of Clubs or Organizations:   . Attends Archivist Meetings:   Marland Kitchen Marital Status:     Family History  Problem Relation Age of Onset  . Arthritis Mother   . Osteoporosis Mother        died after fall with fx. hips and PE  . Stroke Mother        post immobilization with vertebral fracture  . Migraines Mother   . COPD Father   . Colon cancer Paternal Aunt 21  . Heart disease Paternal Aunt        X 2; both had MI > 68  . Diabetes Paternal Aunt   . Heart failure Paternal Grandmother   . Cancer - Colon Unknown        nephew - chemo    Review of Systems     Objective:   Vitals:   01/23/20 0802  BP: (!) 146/90  Pulse: 75  Resp: 16  Temp: 98.1 F (36.7 C)  SpO2: 97%   BP Readings from Last 3  Encounters:  01/23/20 (!) 146/90  03/15/19 (!) 164/92  06/20/18 (!) 142/80   Wt Readings from Last 3 Encounters:  01/23/20 146 lb 12.8 oz (66.6 kg)  03/15/19 147 lb (66.7 kg)  06/20/18 145 lb 1.9 oz (65.8 kg)   Body mass index is 26 kg/m.   Physical Exam    Constitutional: Appears well-developed and well-nourished. No distress.  Psychiatric: Normal mood and affect. Behavior is normal.      Assessment & Plan:       See Problem List for Assessment and Plan of chronic medical problems.    This visit occurred during the SARS-CoV-2 public health emergency.  Safety protocols were in place, including screening questions prior to the visit, additional usage of staff PPE, and extensive cleaning of exam room while observing appropriate contact time as indicated for disinfecting solutions.

## 2020-01-23 ENCOUNTER — Ambulatory Visit: Payer: PPO

## 2020-01-23 ENCOUNTER — Ambulatory Visit (INDEPENDENT_AMBULATORY_CARE_PROVIDER_SITE_OTHER): Payer: PPO | Admitting: Internal Medicine

## 2020-01-23 ENCOUNTER — Other Ambulatory Visit: Payer: Self-pay

## 2020-01-23 ENCOUNTER — Encounter: Payer: Self-pay | Admitting: Internal Medicine

## 2020-01-23 VITALS — BP 146/90 | HR 75 | Temp 98.1°F | Resp 16 | Ht 63.0 in | Wt 146.8 lb

## 2020-01-23 DIAGNOSIS — M818 Other osteoporosis without current pathological fracture: Secondary | ICD-10-CM | POA: Diagnosis not present

## 2020-01-23 MED ORDER — ALENDRONATE SODIUM 70 MG PO TABS: 70.0000 mg | ORAL_TABLET | ORAL | 11 refills | Status: DC

## 2020-01-23 NOTE — Patient Instructions (Signed)
Medications reviewed and updated.  Changes include :   Fosamax 70 mg weekly.    Your prescription(s) have been submitted to your pharmacy. Please take as directed and contact our office if you believe you are having problem(s) with the medication(s).  We will do a bone density every two years.      Osteoporosis  Osteoporosis is thinning and loss of density in your bones. Osteoporosis makes bones more brittle and fragile and more likely to break (fracture). Over time, osteoporosis can cause your bones to become so weak that they fracture after a minor fall. Bones in the hip, wrist, and spine are most likely to fracture due to osteoporosis. What are the causes? The exact cause of this condition is not known. What increases the risk? You may be at greater risk for osteoporosis if you:  Have a family history of the condition.  Have poor nutrition.  Use steroid medicines, such as prednisone.  Are female.  Are age 39 or older.  Smoke or have a history of smoking.  Are not physically active (are sedentary).  Are white (Caucasian) or of Asian descent.  Have a small body frame.  Take certain medicines, such as antiseizure medicines. What are the signs or symptoms? A fracture might be the first sign of osteoporosis, especially if the fracture results from a fall or injury that usually would not cause a bone to break. Other signs and symptoms include:  Pain in the neck or low back.  Stooped posture.  Loss of height. How is this diagnosed? This condition may be diagnosed based on:  Your medical history.  A physical exam.  A bone mineral density test, also called a DXA or DEXA test (dual-energy X-ray absorptiometry test). This test uses X-rays to measure the amount of minerals in your bones. How is this treated? The goal of treatment is to strengthen your bones and lower your risk for a fracture. Treatment may involve:  Making lifestyle changes, such as: ? Including foods  with more calcium and vitamin D in your diet. ? Doing weight-bearing and muscle-strengthening exercises. ? Stopping tobacco use. ? Limiting alcohol intake.  Taking medicine to slow the process of bone loss or to increase bone density.  Taking daily supplements of calcium and vitamin D.  Taking hormone replacement medicines, such as estrogen for women and testosterone for men.  Monitoring your levels of calcium and vitamin D. Follow these instructions at home:  Activity  Exercise as told by your health care provider. Ask your health care provider what exercises and activities are safe for you. You should do: ? Exercises that make you work against gravity (weight-bearing exercises), such as tai chi, yoga, or walking. ? Exercises to strengthen muscles, such as lifting weights. Lifestyle  Limit alcohol intake to no more than 1 drink a day for nonpregnant women and 2 drinks a day for men. One drink equals 12 oz of beer, 5 oz of wine, or 1 oz of hard liquor.  Do not use any products that contain nicotine or tobacco, such as cigarettes and e-cigarettes. If you need help quitting, ask your health care provider. Preventing falls  Use devices to help you move around (mobility aids) as needed, such as canes, walkers, scooters, or crutches.  Keep rooms well-lit and clutter-free.  Remove tripping hazards from walkways, including cords and throw rugs.  Install grab bars in bathrooms and safety rails on stairs.  Use rubber mats in the bathroom and other areas that are often  wet or slippery.  Wear closed-toe shoes that fit well and support your feet. Wear shoes that have rubber soles or low heels.  Review your medicines with your health care provider. Some medicines can cause dizziness or changes in blood pressure, which can increase your risk of falling. General instructions  Include calcium and vitamin D in your diet. Calcium is important for bone health, and vitamin D helps your body to  absorb calcium. Good sources of calcium and vitamin D include: ? Certain fatty fish, such as salmon and tuna. ? Products that have calcium and vitamin D added to them (fortified products), such as fortified cereals. ? Egg yolks. ? Cheese. ? Liver.  Take over-the-counter and prescription medicines only as told by your health care provider.  Keep all follow-up visits as told by your health care provider. This is important. Contact a health care provider if:  You have never been screened for osteoporosis and you are: ? A woman who is age 14 or older. ? A man who is age 37 or older. Get help right away if:  You fall or injure yourself. Summary  Osteoporosis is thinning and loss of density in your bones. This makes bones more brittle and fragile and more likely to break (fracture),even with minor falls.  The goal of treatment is to strengthen your bones and reduce your risk for a fracture.  Include calcium and vitamin D in your diet. Calcium is important for bone health, and vitamin D helps your body to absorb calcium.  Talk with your health care provider about screening for osteoporosis if you are a woman who is age 55 or older, or a man who is age 88 or older. This information is not intended to replace advice given to you by your health care provider. Make sure you discuss any questions you have with your health care provider. Document Revised: 07/22/2017 Document Reviewed: 06/03/2017 Elsevier Patient Education  2020 [employer].

## 2020-01-23 NOTE — Assessment & Plan Note (Signed)
Chronic Severe osteopenia with Increased frax so high risk for fracture warranting treatment Reviewed bone density and changes since two years ago Taking MVI most days - advised daily.  Vitamin d level good last summer - will recheck with next blood work Continue high level of activity Discussed treatment options and benefits of treatment.  Reviewed possible side effects Will start fosamax 70 mg weekly - advised how to take it.  She is not good at swallowing pills so she may/may not tolerate this  -- she will let me know - they have an Effervesent pill which looks like it is not covered

## 2020-04-11 DIAGNOSIS — L0390 Cellulitis, unspecified: Secondary | ICD-10-CM | POA: Diagnosis not present

## 2020-07-16 DIAGNOSIS — H2513 Age-related nuclear cataract, bilateral: Secondary | ICD-10-CM | POA: Diagnosis not present

## 2020-08-28 NOTE — Progress Notes (Unsigned)
Subjective:    Patient ID: Madison Wright, female    DOB: August 18, 1946, 75 y.o.   MRN: [id number]  HPI The patient is here for an acute visit.   Leg injury - she ran into a rusty tomato in august.  Went to urgent urgent and she was prescribed clindamycin.  The past couple of weeks it has been looking worse - more red and some tenderness.    Medications and allergies reviewed with patient and updated if appropriate.  Patient Active Problem List   Diagnosis Date Noted  . Prediabetes 11/20/2015  . Vitamin D deficiency 10/21/2015  . Elevated blood pressure reading without diagnosis of hypertension 10/21/2015  . Diverticulosis of large intestine 01/07/2010  . Osteoporosis 01/07/2010  . Hyperlipidemia 03/06/2008  . GRANULOMA ANNULARE 03/06/2008    Current Outpatient Medications on File Prior to Visit  Medication Sig Dispense Refill  . Apoaequorin (PREVAGEN PO) Take by mouth.    Marland Kitchen FLUAD QUADRIVALENT 0.5 ML injection     . Multiple Vitamin (MULTIVITAMIN) tablet Take 1 tablet by mouth daily.    . Probiotic Product (ALIGN PO) Take by mouth.    Marland Kitchen alendronate (FOSAMAX) 70 MG tablet Take 1 tablet (70 mg total) by mouth every 7 (seven) days. Take with a full glass of water on an empty stomach. (Patient not taking: Reported on 08/29/2020) 4 tablet 11   No current facility-administered medications on file prior to visit.    Past Medical History:  Diagnosis Date  . Diverticulosis of colon   . Granuloma annulare 2008   Dr Amy [place]  . Menopausal state 11/2000   Took HRT X 10 days only  in 05/2001 secondary to [facility] breast ca  . Osteoporosis   . Post-menopausal atrophic vaginitis 07/2005  . Vitamin D deficiency     Past Surgical History:  Procedure Laterality Date  . COLONOSCOPY  2004  & 2014   diverticulosis, Dr Juanda Chance  . SKIN CANCER EXCISION  2003   Basal skin  . WRIST FRACTURE SURGERY  2012   [facility]    Social History   Socioeconomic History  . Marital status: Married     Spouse name: Alessandra Sawdey  . Number of children: 0  . Years of education: Not on file  . Highest education level: Not on file  Occupational History  . Occupation: Nutritional therapist: [employer]   Tobacco Use  . Smoking status: Never Smoker  . Smokeless tobacco: Never Used  Vaping Use  . Vaping Use: Never used  Substance and Sexual Activity  . Alcohol use: No    Alcohol/week: 0.0 standard drinks  . Drug use: No  . Sexual activity: Not Currently    Partners: Male    Birth control/protection: Post-menopausal  Other Topics Concern  . Not on file  Social History Narrative   exercises   Social Determinants of Health   Financial Resource Strain: Not on file  Food Insecurity: Not on file  Transportation Needs: Not on file  Physical Activity: Not on file  Stress: Not on file  Social Connections: Not on file    Family History  Problem Relation Age of Onset  . Arthritis Mother   . Osteoporosis Mother        died after fall with fx. hips and PE  . Stroke Mother        post immobilization with vertebral fracture  . Migraines Mother   . COPD Father   . Colon  cancer Paternal Aunt 38  . Heart disease Paternal Aunt        X 2; both had MI > 94  . Diabetes Paternal Aunt   . Heart failure Paternal Grandmother   . Cancer - Colon Unknown        nephew - chemo    Review of Systems  Constitutional: Negative for chills and fever.  Skin: Positive for color change and wound.  Neurological: Negative for light-headedness and headaches.       Objective:   Vitals:   08/29/20 0827  BP: 140/78  Pulse: 96  Temp: 98.4 F (36.9 C)  SpO2: 98%   BP Readings from Last 3 Encounters:  08/29/20 140/78  01/23/20 (!) 146/90  03/15/19 (!) 164/92   Wt Readings from Last 3 Encounters:  08/29/20 145 lb 3.2 oz (65.9 kg)  01/23/20 146 lb 12.8 oz (66.6 kg)  03/15/19 147 lb (66.7 kg)   Body mass index is 25.72 kg/m.   Physical Exam Constitutional:      General: She is  not in acute distress.    Appearance: Normal appearance. She is not ill-appearing.  HENT:     Head: Normocephalic and atraumatic.  Skin:    General: Skin is warm and dry.     Findings: Lesion (right proximal anterior lower leg - quarter size around or erythema with dry, crusty center and mild surounding erythema - tender with palpation especially in medal-posterior aspect, no fluctuance, not open, no discharge, slighlty warm) present.  Neurological:     Mental Status: She is alert.            Assessment & Plan:    See Problem List for Assessment and Plan of chronic medical problems.    This visit occurred during the SARS-CoV-2 public health emergency.  Safety protocols were in place, including screening questions prior to the visit, additional usage of staff PPE, and extensive cleaning of exam room while observing appropriate contact time as indicated for disinfecting solutions.

## 2020-08-29 ENCOUNTER — Other Ambulatory Visit: Payer: Self-pay

## 2020-08-29 ENCOUNTER — Encounter: Payer: Self-pay | Admitting: Internal Medicine

## 2020-08-29 ENCOUNTER — Ambulatory Visit (INDEPENDENT_AMBULATORY_CARE_PROVIDER_SITE_OTHER): Payer: PPO

## 2020-08-29 ENCOUNTER — Ambulatory Visit (INDEPENDENT_AMBULATORY_CARE_PROVIDER_SITE_OTHER): Payer: PPO | Admitting: Internal Medicine

## 2020-08-29 VITALS — BP 140/78 | HR 96 | Temp 98.4°F | Ht 63.0 in | Wt 145.2 lb

## 2020-08-29 DIAGNOSIS — Z23 Encounter for immunization: Secondary | ICD-10-CM

## 2020-08-29 DIAGNOSIS — L03115 Cellulitis of right lower limb: Secondary | ICD-10-CM

## 2020-08-29 MED ORDER — DOXYCYCLINE HYCLATE 100 MG PO TABS: 100.0000 mg | ORAL_TABLET | Freq: Two times a day (BID) | ORAL | 0 refills | Status: DC

## 2020-08-29 NOTE — Assessment & Plan Note (Signed)
Acute Related to 3 from August-went into a metal stake in the garden-the one that surrounds a tomato plant Went to urgent care at that time diagnosed with cellulitis-prescribed clindamycin every 6 hours x10 days-upon completion there was improvement and she thought resolution.  Did not receive a tetanus In the past 2 weeks has become more red and slightly tender Recurrent cellulitis,?  Foreign body X-ray today Start doxycycline 100 mg twice daily x10 days Depending on x-ray results and if there is no improvement may need to see a specialist for further evaluation

## 2020-08-29 NOTE — Addendum Note (Signed)
Addended by: Marcina Millard on: 08/29/2020 09:10 AM   Modules accepted: Orders

## 2020-08-29 NOTE — Patient Instructions (Addendum)
You had a tetanus vaccine today.  Take the prescribed antibiotic.   Have an xray downstairs today.    Please call if there is no improvement in your symptoms so we can determine the next step.

## 2020-09-19 ENCOUNTER — Encounter: Payer: Self-pay | Admitting: Internal Medicine

## 2020-09-19 NOTE — Progress Notes (Signed)
Outside notes received. Information abstracted. Notes sent to scan.  

## 2020-09-29 ENCOUNTER — Telehealth: Payer: Self-pay | Admitting: Internal Medicine

## 2020-09-29 NOTE — Telephone Encounter (Signed)
LVM for pt to rtn my call to schedule AWV with [facility]. Please schedule if pt calls the office.  ?

## 2020-11-05 NOTE — Progress Notes (Signed)
Subjective:    Patient ID: Madison Wright, female    DOB: [birth year], 75 y.o.   MRN: [id number]  HPI The patient is here for follow up of their chronic medical problems, including prediabetes, OP.  She is here with her husband.    Memory - her short term memory is not good.  She is taking prevagen.  She forgets appointments and what time she has to go someplace.  She still works at [employer] a three days a week.  She wakes up some days and has to ask her husband if she has to wok today.  She still drives and has not gotten lost.       Medications and allergies reviewed with patient and updated if appropriate.  Patient Active Problem List   Diagnosis Date Noted  . Memory difficulties 11/06/2020  . Cellulitis of right lower extremity 08/29/2020  . Prediabetes 11/20/2015  . Vitamin D deficiency 10/21/2015  . Elevated blood pressure reading without diagnosis of hypertension 10/21/2015  . Diverticulosis of large intestine 01/07/2010  . Osteoporosis 01/07/2010  . Hyperlipidemia 03/06/2008  . GRANULOMA ANNULARE 03/06/2008    Current Outpatient Medications on File Prior to Visit  Medication Sig Dispense Refill  . alendronate (FOSAMAX) 70 MG tablet Take 1 tablet (70 mg total) by mouth every 7 (seven) days. Take with a full glass of water on an empty stomach. 4 tablet 11  . Apoaequorin (PREVAGEN PO) Take by mouth.    . Multiple Vitamin (MULTIVITAMIN) tablet Take 1 tablet by mouth daily.    . Probiotic Product (ALIGN PO) Take by mouth.     No current facility-administered medications on file prior to visit.    Past Medical History:  Diagnosis Date  . Diverticulosis of colon   . Granuloma annulare 2008   Dr Amy Martinique  . Menopausal state 11/2000   Took HRT X 10 days only  in 05/2001 secondary to [facility] breast ca  . Osteoporosis   . Post-menopausal atrophic vaginitis 07/2005  . Vitamin D deficiency     Past Surgical History:  Procedure Laterality Date  . COLONOSCOPY  2004  & 2014    diverticulosis, Dr Olevia Perches  . SKIN CANCER EXCISION  2003   Basal skin  . WRIST FRACTURE SURGERY  2012   [facility]    Social History   Socioeconomic History  . Marital status: Married    Spouse name: Ameliah Baskins  . Number of children: 0  . Years of education: Not on file  . Highest education level: Not on file  Occupational History  . Occupation: Research scientist (physical sciences): [employer]   Tobacco Use  . Smoking status: Never Smoker  . Smokeless tobacco: Never Used  Vaping Use  . Vaping Use: Never used  Substance and Sexual Activity  . Alcohol use: No    Alcohol/week: 0.0 standard drinks  . Drug use: No  . Sexual activity: Not Currently    Partners: Male    Birth control/protection: Post-menopausal  Other Topics Concern  . Not on file  Social History Narrative   exercises   Social Determinants of Health   Financial Resource Strain: Not on file  Food Insecurity: Not on file  Transportation Needs: Not on file  Physical Activity: Not on file  Stress: Not on file  Social Connections: Not on file    Family History  Problem Relation Age of Onset  . Arthritis Mother   . Osteoporosis Mother  died after fall with fx. hips and PE  . Stroke Mother        post immobilization with vertebral fracture  . Migraines Mother   . COPD Father   . Colon cancer Paternal Aunt 21  . Heart disease Paternal Aunt        X 2; both had MI > 75  . Diabetes Paternal Aunt   . Heart failure Paternal Grandmother   . Cancer - Colon Unknown        nephew - chemo    Review of Systems  Constitutional: Negative for appetite change and fever.  Respiratory: Negative for cough, shortness of breath and wheezing.   Cardiovascular: Negative for chest pain, palpitations and leg swelling.  Neurological: Negative for light-headedness and headaches.  Psychiatric/Behavioral: Negative for dysphoric mood and sleep disturbance. The patient is not nervous/anxious.        Objective:    Vitals:   11/06/20 0916  BP: 138/84  Pulse: 72  Temp: 98 F (36.7 C)  SpO2: 97%   BP Readings from Last 3 Encounters:  11/06/20 138/84  08/29/20 140/78  01/23/20 (!) 146/90   Wt Readings from Last 3 Encounters:  11/06/20 144 lb (65.3 kg)  08/29/20 145 lb 3.2 oz (65.9 kg)  01/23/20 146 lb 12.8 oz (66.6 kg)   Body mass index is 25.51 kg/m.   Physical Exam    Constitutional: Appears well-developed and well-nourished. No distress.  HENT:  Head: Normocephalic and atraumatic.  Neck: Neck supple. No tracheal deviation present. No thyromegaly present.  No cervical lymphadenopathy Cardiovascular: Normal rate, regular rhythm and normal heart sounds.   No murmur heard. No carotid bruit .  No edema Pulmonary/Chest: Effort normal and breath sounds normal. No respiratory distress. No has no wheezes. No rales.  Skin: Skin is warm and dry. Not diaphoretic.  Psychiatric: Normal mood and affect. Behavior is normal.      Assessment & Plan:    See Problem List for Assessment and Plan of chronic medical problems.    This visit occurred during the SARS-CoV-2 public health emergency.  Safety protocols were in place, including screening questions prior to the visit, additional usage of staff PPE, and extensive cleaning of exam room while observing appropriate contact time as indicated for disinfecting solutions.

## 2020-11-06 ENCOUNTER — Encounter: Payer: Self-pay | Admitting: Internal Medicine

## 2020-11-06 ENCOUNTER — Other Ambulatory Visit: Payer: Self-pay

## 2020-11-06 ENCOUNTER — Ambulatory Visit: Payer: PPO

## 2020-11-06 ENCOUNTER — Ambulatory Visit (INDEPENDENT_AMBULATORY_CARE_PROVIDER_SITE_OTHER): Payer: PPO | Admitting: Internal Medicine

## 2020-11-06 VITALS — BP 138/84 | HR 72 | Temp 98.0°F | Ht 63.0 in | Wt 144.0 lb

## 2020-11-06 DIAGNOSIS — E538 Deficiency of other specified B group vitamins: Secondary | ICD-10-CM

## 2020-11-06 DIAGNOSIS — R7303 Prediabetes: Secondary | ICD-10-CM

## 2020-11-06 DIAGNOSIS — M818 Other osteoporosis without current pathological fracture: Secondary | ICD-10-CM

## 2020-11-06 DIAGNOSIS — R413 Other amnesia: Secondary | ICD-10-CM | POA: Diagnosis not present

## 2020-11-06 HISTORY — DX: Deficiency of other specified B group vitamins: E53.8

## 2020-11-06 LAB — COMPREHENSIVE METABOLIC PANEL
ALT: 9 U/L (ref 0–35)
AST: 19 U/L (ref 0–37)
Albumin: 4 g/dL (ref 3.5–5.2)
Alkaline Phosphatase: 94 U/L (ref 39–117)
BUN: 8 mg/dL (ref 6–23)
CO2: 30 mEq/L (ref 19–32)
Calcium: 9.3 mg/dL (ref 8.4–10.5)
Chloride: 102 mEq/L (ref 96–112)
Creatinine, Ser: 0.56 mg/dL (ref 0.40–1.20)
GFR: 90.89 mL/min (ref >60.00)
Glucose, Bld: 101 mg/dL — ABNORMAL HIGH (ref 70–99)
Potassium: 3.8 mEq/L (ref 3.5–5.1)
Sodium: 140 mEq/L (ref 135–145)
Total Bilirubin: 0.8 mg/dL (ref 0.2–1.2)
Total Protein: 6.9 g/dL (ref 6.0–8.3)

## 2020-11-06 LAB — VITAMIN B12: Vitamin B-12: 134 pg/mL — ABNORMAL LOW (ref 211–911)

## 2020-11-06 LAB — VITAMIN D 25 HYDROXY (VIT D DEFICIENCY, FRACTURES): VITD: 38.05 ng/mL (ref 30.00–100.00)

## 2020-11-06 LAB — HEMOGLOBIN A1C: Hgb A1c MFr Bld: 5.9 % (ref 4.6–6.5)

## 2020-11-06 LAB — TSH: TSH: 1 u[IU]/mL (ref 0.35–4.50)

## 2020-11-06 NOTE — Assessment & Plan Note (Signed)
Chronic Check a1c Low sugar / carb diet Stressed regular exercise  

## 2020-11-06 NOTE — Assessment & Plan Note (Addendum)
Chronic Taking fosamax 70 mg weekly ? -- continue weekly Taking MVI Check vitamin D Encouraged regular exercise

## 2020-11-06 NOTE — Patient Instructions (Signed)
  Blood work was ordered.     Medications changes include :   none    A referral was ordered for a neuropsychologist at [facility].       Someone from their office will call you to schedule an appointment.    Please followup in 1  year

## 2020-11-06 NOTE — Assessment & Plan Note (Signed)
New problem Difficulty keeping track of days, appointments and times Independent ADL's has not gotten lost while driving Possible MCI Will check tsh, B12 level Referral to neuropsych

## 2020-11-12 ENCOUNTER — Ambulatory Visit: Payer: PPO

## 2020-11-17 ENCOUNTER — Encounter: Payer: Self-pay | Admitting: Psychology

## 2020-11-25 ENCOUNTER — Telehealth: Payer: Self-pay

## 2020-11-25 ENCOUNTER — Ambulatory Visit: Payer: PPO

## 2020-11-25 ENCOUNTER — Other Ambulatory Visit: Payer: Self-pay

## 2020-11-25 NOTE — Telephone Encounter (Signed)
Attempted to contact patient to complete AWV scheduled for 9:45 am today.  Patient did not answer the phone.  Left voicemail to return call to office within the next 15 minutes.  Will attempt to call again or follow up.

## 2021-01-07 ENCOUNTER — Encounter: Payer: Self-pay | Admitting: Internal Medicine

## 2021-01-07 DIAGNOSIS — Z1231 Encounter for screening mammogram for malignant neoplasm of breast: Secondary | ICD-10-CM | POA: Diagnosis not present

## 2021-01-07 LAB — HM MAMMOGRAPHY

## 2021-01-08 ENCOUNTER — Other Ambulatory Visit: Payer: Self-pay

## 2021-01-08 ENCOUNTER — Ambulatory Visit (INDEPENDENT_AMBULATORY_CARE_PROVIDER_SITE_OTHER): Payer: PPO

## 2021-01-08 DIAGNOSIS — Z0000 Encounter for general adult medical examination without abnormal findings: Secondary | ICD-10-CM

## 2021-01-08 NOTE — Patient Instructions (Signed)
Madison Wright , Thank you for taking time to come for your Medicare Wellness Visit. I appreciate your ongoing commitment to your health goals. Please review the following plan we discussed and let me know if I can assist you in the future.   Screening recommendations/referrals: Colonoscopy: 10/25/2012; due every 10 years Mammogram: 01/02/2020 Bone Density: 01/02/2020 Recommended yearly ophthalmology/optometry visit for glaucoma screening and checkup Recommended yearly dental visit for hygiene and checkup  Vaccinations: Influenza vaccine: due Fall 2022 Pneumococcal vaccine: 09/12/2013, 04/30/2015 Tdap vaccine: 08/29/2020; due every 10 years Shingles vaccine: Please call your insurance company to determine your out of pocket expense for the Shingrix vaccine. You may receive this vaccine at your local pharmacy. Covid-19: 09/28/2019, 10/24/2019  Advanced directives: Please bring a copy of your health care power of attorney and living will to the office at your convenience.  Conditions/risks identified: Yes; Reviewed health maintenance screenings with patient today and relevant education, vaccines, and/or referrals were provided. Please continue to do your personal lifestyle choices by: daily care of teeth and gums, regular physical activity (goal should be 5 days a week for 30 minutes), eat a healthy diet, avoid tobacco and drug use, limiting any alcohol intake, taking a low-dose aspirin (if not allergic or have been advised by your provider otherwise) and taking vitamins and minerals as recommended by your provider. Continue doing brain stimulating activities (puzzles, reading, adult coloring books, staying active) to keep memory sharp. Continue to eat heart healthy diet (full of fruits, vegetables, whole grains, lean protein, water--limit salt, fat, and sugar intake) and increase physical activity as tolerated.  Next appointment: Please schedule your next Medicare Wellness Visit with your Nurse Health Advisor in  1 year by calling [phone].  Preventive Care 66 Years and Older, Female Preventive care refers to lifestyle choices and visits with your health care provider that can promote health and wellness. What does preventive care include?  A yearly physical exam. This is also called an annual well check.  Dental exams once or twice a year.  Routine eye exams. Ask your health care provider how often you should have your eyes checked.  Personal lifestyle choices, including:  Daily care of your teeth and gums.  Regular physical activity.  Eating a healthy diet.  Avoiding tobacco and drug use.  Limiting alcohol use.  Practicing safe sex.  Taking low-dose aspirin every day.  Taking vitamin and mineral supplements as recommended by your health care provider. What happens during an annual well check? The services and screenings done by your health care provider during your annual well check will depend on your age, overall health, lifestyle risk factors, and family history of disease. Counseling  Your health care provider may ask you questions about your:  Alcohol use.  Tobacco use.  Drug use.  Emotional well-being.  Home and relationship well-being.  Sexual activity.  Eating habits.  History of falls.  Memory and ability to understand (cognition).  Work and work Statistician.  Reproductive health. Screening  You may have the following tests or measurements:  Height, weight, and BMI.  Blood pressure.  Lipid and cholesterol levels. These may be checked every 5 years, or more frequently if you are over 34 years old.  Skin check.  Lung cancer screening. You may have this screening every year starting at age 31 if you have a 30-pack-year history of smoking and currently smoke or have quit within the past 15 years.  Fecal occult blood test (FOBT) of the stool. You may have  this test every year starting at age 52.  Flexible sigmoidoscopy or colonoscopy. You may  have a sigmoidoscopy every 5 years or a colonoscopy every 10 years starting at age 77.  Hepatitis C blood test.  Hepatitis B blood test.  Sexually transmitted disease (STD) testing.  Diabetes screening. This is done by checking your blood sugar (glucose) after you have not eaten for a while (fasting). You may have this done every 1-3 years.  Bone density scan. This is done to screen for osteoporosis. You may have this done starting at age 2.  Mammogram. This may be done every 1-2 years. Talk to your health care provider about how often you should have regular mammograms. Talk with your health care provider about your test results, treatment options, and if necessary, the need for more tests. Vaccines  Your health care provider may recommend certain vaccines, such as:  Influenza vaccine. This is recommended every year.  Tetanus, diphtheria, and acellular pertussis (Tdap, Td) vaccine. You may need a Td booster every 10 years.  Zoster vaccine. You may need this after age 27.  Pneumococcal 13-valent conjugate (PCV13) vaccine. One dose is recommended after age 57.  Pneumococcal polysaccharide (PPSV23) vaccine. One dose is recommended after age 5. Talk to your health care provider about which screenings and vaccines you need and how often you need them. This information is not intended to replace advice given to you by your health care provider. Make sure you discuss any questions you have with your health care provider. Document Released: 09/05/2015 Document Revised: 04/28/2016 Document Reviewed: 06/10/2015 Elsevier Interactive Patient Education  2017 [place] Prevention in the Home Falls can cause injuries. They can happen to people of all ages. There are many things you can do to make your home safe and to help prevent falls. What can I do on the outside of my home?  Regularly fix the edges of walkways and driveways and fix any cracks.  Remove anything that might make  you trip as you walk through a door, such as a raised step or threshold.  Trim any bushes or trees on the path to your home.  Use bright outdoor lighting.  Clear any walking paths of anything that might make someone trip, such as rocks or tools.  Regularly check to see if handrails are loose or broken. Make sure that both sides of any steps have handrails.  Any raised decks and porches should have guardrails on the edges.  Have any leaves, snow, or ice cleared regularly.  Use sand or salt on walking paths during winter.  Clean up any spills in your garage right away. This includes oil or grease spills. What can I do in the bathroom?  Use night lights.  Install grab bars by the toilet and in the tub and shower. Do not use towel bars as grab bars.  Use non-skid mats or decals in the tub or shower.  If you need to sit down in the shower, use a plastic, non-slip stool.  Keep the floor dry. Clean up any water that spills on the floor as soon as it happens.  Remove soap buildup in the tub or shower regularly.  Attach bath mats securely with double-sided non-slip rug tape.  Do not have throw rugs and other things on the floor that can make you trip. What can I do in the bedroom?  Use night lights.  Make sure that you have a light by your bed that is easy to  reach.  Do not use any sheets or blankets that are too big for your bed. They should not hang down onto the floor.  Have a firm chair that has side arms. You can use this for support while you get dressed.  Do not have throw rugs and other things on the floor that can make you trip. What can I do in the kitchen?  Clean up any spills right away.  Avoid walking on wet floors.  Keep items that you use a lot in easy-to-reach places.  If you need to reach something above you, use a strong step stool that has a grab bar.  Keep electrical cords out of the way.  Do not use floor polish or wax that makes floors slippery.  If you must use wax, use non-skid floor wax.  Do not have throw rugs and other things on the floor that can make you trip. What can I do with my stairs?  Do not leave any items on the stairs.  Make sure that there are handrails on both sides of the stairs and use them. Fix handrails that are broken or loose. Make sure that handrails are as long as the stairways.  Check any carpeting to make sure that it is firmly attached to the stairs. Fix any carpet that is loose or worn.  Avoid having throw rugs at the top or bottom of the stairs. If you do have throw rugs, attach them to the floor with carpet tape.  Make sure that you have a light switch at the top of the stairs and the bottom of the stairs. If you do not have them, ask someone to add them for you. What else can I do to help prevent falls?  Wear shoes that:  Do not have high heels.  Have rubber bottoms.  Are comfortable and fit you well.  Are closed at the toe. Do not wear sandals.  If you use a stepladder:  Make sure that it is fully opened. Do not climb a closed stepladder.  Make sure that both sides of the stepladder are locked into place.  Ask someone to hold it for you, if possible.  Clearly mark and make sure that you can see:  Any grab bars or handrails.  First and last steps.  Where the edge of each step is.  Use tools that help you move around (mobility aids) if they are needed. These include:  Canes.  Walkers.  Scooters.  Crutches.  Turn on the lights when you go into a dark area. Replace any light bulbs as soon as they burn out.  Set up your furniture so you have a clear path. Avoid moving your furniture around.  If any of your floors are uneven, fix them.  If there are any pets around you, be aware of where they are.  Review your medicines with your doctor. Some medicines can make you feel dizzy. This can increase your chance of falling. Ask your doctor what other things that you can do to  help prevent falls. This information is not intended to replace advice given to you by your health care provider. Make sure you discuss any questions you have with your health care provider. Document Released: 06/05/2009 Document Revised: 01/15/2016 Document Reviewed: 09/13/2014 Elsevier Interactive Patient Education  2017 [employer].

## 2021-01-08 NOTE — Progress Notes (Signed)
I connected with Nava Song today by telephone and verified that I am speaking with the correct person using two identifiers. Location patient: home Location provider: work Persons participating in the virtual visit: Lee-Ann Gal and Lisette Abu, LPN.   I discussed the limitations, risks, security and privacy concerns of performing an evaluation and management service by telephone and the availability of in person appointments. I also discussed with the patient that there may be a patient responsible charge related to this service. The patient expressed understanding and verbally consented to this telephonic visit.    Interactive audio and video telecommunications were attempted between this provider and patient, however failed, due to patient having technical difficulties OR patient did not have access to video capability.  We continued and completed visit with audio only.  Some vital signs may be absent or patient reported.   Time Spent with patient on telephone encounter: 20 minutes  Subjective:   Madison Wright is a 75 y.o. female who presents for Medicare Annual (Subsequent) preventive examination.  Review of Systems    No ROS. Medicare Wellness Visit. Additional risk factors are reflected in social history. Cardiac Risk Factors include: advanced age (>18men, >15 women);family history of premature cardiovascular disease Sleep Patterns: No sleep issues, feels rested on waking and sleeps 8 hours nightly. Home Safety/Smoke Alarms: Feels safe in home; uses home alarm. Smoke alarms in place. Living environment: 1-story home; Lives with spouse; no needs for DME; good support system. Seat Belt Safety/Bike Helmet: Wears seat belt.    Objective:    There were no vitals filed for this visit. There is no height or weight on file to calculate BMI.  Advanced Directives 01/08/2021 04/30/2015  Does Patient Have a Medical Advance Directive? Yes Yes  Type of Advance Directive Living  will;Healthcare Power of Attorney -  Does patient want to make changes to medical advance directive? No - Patient declined -  Copy of [place] in Chart? No - copy requested Yes    Current Medications (verified) Outpatient Encounter Medications as of 01/08/2021  Medication Sig  . Apoaequorin (PREVAGEN PO) Take by mouth.  . Multiple Vitamin (MULTIVITAMIN) tablet Take 1 tablet by mouth daily.  . Probiotic Product (ALIGN PO) Take by mouth.  Marland Kitchen alendronate (FOSAMAX) 70 MG tablet Take 1 tablet (70 mg total) by mouth every 7 (seven) days. Take with a full glass of water on an empty stomach. (Patient not taking: Reported on 01/08/2021)   No facility-administered encounter medications on file as of 01/08/2021.    Allergies (verified) Sulfonamide derivatives and Codeine   History: Past Medical History:  Diagnosis Date  . Diverticulosis of colon   . Granuloma annulare 2008   Dr Amy Martinique  . Menopausal state 11/2000   Took HRT X 10 days only  in 05/2001 secondary to [facility] breast ca  . Osteoporosis   . Post-menopausal atrophic vaginitis 07/2005  . Vitamin D deficiency    Past Surgical History:  Procedure Laterality Date  . COLONOSCOPY  2004  & 2014   diverticulosis, Dr Olevia Perches  . SKIN CANCER EXCISION  2003   Basal skin  . WRIST FRACTURE SURGERY  2012   [facility]   Family History  Problem Relation Age of Onset  . Arthritis Mother   . Osteoporosis Mother        died after fall with fx. hips and PE  . Stroke Mother        post immobilization with vertebral fracture  .  Migraines Mother   . COPD Father   . Colon cancer Paternal Aunt 46  . Heart disease Paternal Aunt        X 2; both had MI > 67  . Diabetes Paternal Aunt   . Heart failure Paternal Grandmother   . Cancer - Colon Unknown        nephew - chemo   Social History   Socioeconomic History  . Marital status: Married    Spouse name: Tniyah Nakagawa  . Number of children: 0  . Years of education: Not on  file  . Highest education level: Not on file  Occupational History  . Occupation: Research scientist (physical sciences): [employer]   Tobacco Use  . Smoking status: Never Smoker  . Smokeless tobacco: Never Used  Vaping Use  . Vaping Use: Never used  Substance and Sexual Activity  . Alcohol use: No    Alcohol/week: 0.0 standard drinks  . Drug use: No  . Sexual activity: Not Currently    Partners: Male    Birth control/protection: Post-menopausal  Other Topics Concern  . Not on file  Social History Narrative   exercises   Social Determinants of Health   Financial Resource Strain: Low Risk   . Difficulty of Paying Living Expenses: Not hard at all  Food Insecurity: No Food Insecurity  . Worried About Charity fundraiser in the Last Year: Never true  . Ran Out of Food in the Last Year: Never true  Transportation Needs: No Transportation Needs  . Lack of Transportation (Medical): No  . Lack of Transportation (Non-Medical): No  Physical Activity: Sufficiently Active  . Days of Exercise per Week: 5 days  . Minutes of Exercise per Session: 30 min  Stress: No Stress Concern Present  . Feeling of Stress : Not at all  Social Connections: Socially Integrated  . Frequency of Communication with Friends and Family: More than three times a week  . Frequency of Social Gatherings with Friends and Family: Once a week  . Attends Religious Services: More than 4 times per year  . Active Member of Clubs or Organizations: No  . Attends Archivist Meetings: More than 4 times per year  . Marital Status: Married    Tobacco Counseling Counseling given: Not Answered   Clinical Intake:  Pre-visit preparation completed: Yes  Pain : No/denies pain     Nutritional Risks: None Diabetes: No  How often do you need to have someone help you when you read instructions, pamphlets, or other written materials from your doctor or pharmacy?: 1 - Never What is the last grade level you completed  in school?: High School Graduate  Diabetic? no  Interpreter Needed?: No  Information entered by :: Lisette Abu, LPN   Activities of Daily Living In your present state of health, do you have any difficulty performing the following activities: 01/08/2021 08/29/2020  Hearing? N N  Vision? N N  Difficulty concentrating or making decisions? N N  Walking or climbing stairs? N N  Dressing or bathing? N N  Doing errands, shopping? N N  Preparing Food and eating ? N -  Using the Toilet? N -  In the past six months, have you accidently leaked urine? N -  Do you have problems with loss of bowel control? N -  Managing your Medications? N -  Managing your Finances? N -  Housekeeping or managing your Housekeeping? N -  Some recent data might be hidden  Patient Care Team: Pincus Sanes, MD as PCP - General (Internal Medicine)  Indicate any recent Medical Services you may have received from other than [facility] providers in the past year (date may be approximate).     Assessment:   This is a routine wellness examination for Denni.  Hearing/Vision screen No exam data present  Dietary issues and exercise activities discussed: Current Exercise Habits: Home exercise routine (Works at [employer] 3 times a week), Type of exercise: walking, Time (Minutes): 30, Frequency (Times/Week): 5, Weekly Exercise (Minutes/Week): 150, Intensity: Moderate, Exercise limited by: None identified  Goals Addressed            This Visit's Progress   . Patient Stated       Cut back on my love for sweet tea.      Depression Screen PHQ 2/9 Scores 01/08/2021 08/29/2020 03/15/2019 08/31/2017 10/21/2015 04/30/2015 10/01/2014  PHQ - 2 Score 0 0 0 0 0 0 0    Fall Risk Fall Risk  01/08/2021 01/23/2020 07/18/2019 03/15/2019 08/31/2017  Falls in the past year? 0 0 0 0 No  Comment - - Emmi Telephone Survey: data to providers prior to load - -  Number falls in past yr: 0 0 - 0 -  Injury with Fall? 0 0 - - -  Risk for fall due  to : No Fall Risks - - - -  Follow up Falls evaluation completed Falls evaluation completed - - -    FALL RISK PREVENTION PERTAINING TO THE HOME:  Any stairs in or around the home? No  If so, are there any without handrails? No  Home free of loose throw rugs in walkways, pet beds, electrical cords, etc? Yes  Adequate lighting in your home to reduce risk of falls? Yes   ASSISTIVE DEVICES UTILIZED TO PREVENT FALLS:  Life alert? No  Use of a cane, walker or w/c? No  Grab bars in the bathroom? Yes  Shower chair or bench in shower? No  Elevated toilet seat or a handicapped toilet? No   TIMED UP AND GO:  Was the test performed? No .  Length of time to ambulate 10 feet: 0 sec.   Gait steady and fast without use of assistive device  Cognitive Function: Normal cognitive status assessed by direct observation by this Nurse Health Advisor. No abnormalities found.          Immunizations Immunization History  Administered Date(s) Administered  . Fluad Quad(high Dose 65+) 05/18/2019  . Influenza Split 08/08/2012  . Influenza, High Dose Seasonal PF 06/13/2013, 04/30/2015, 06/02/2016, 06/11/2017, 06/16/2018  . Influenza,inj,Quad PF,6+ Mos 06/13/2014  . Moderna Sars-Covid-2 Vaccination 09/28/2019, 10/24/2019  . Pneumococcal Conjugate-13 04/30/2015  . Pneumococcal Polysaccharide-23 09/12/2013  . Td 03/06/2008  . Tdap 08/29/2020  . Zoster 03/03/2012    TDAP status: Up to date  Flu Vaccine status: Due, Education has been provided regarding the importance of this vaccine. Advised may receive this vaccine at local pharmacy or Health Dept. Aware to provide a copy of the vaccination record if obtained from local pharmacy or Health Dept. Verbalized acceptance and understanding.  Pneumococcal vaccine status: Up to date  Covid-19 vaccine status: Completed vaccines  Qualifies for Shingles Vaccine? Yes   Zostavax completed Yes   Shingrix Completed?: No.    Education has been provided  regarding the importance of this vaccine. Patient has been advised to call insurance company to determine out of pocket expense if they have not yet received this vaccine. Advised may also  receive vaccine at local pharmacy or Health Dept. Verbalized acceptance and understanding.  Screening Tests Health Maintenance  Topic Date Due  . COVID-19 Vaccine (3 - Booster for Moderna series) 03/25/2020  . MAMMOGRAM  01/01/2021  . INFLUENZA VACCINE  03/23/2021  . DEXA SCAN  01/01/2022  . COLONOSCOPY (Pts 45-51yrs Insurance coverage will need to be confirmed)  10/26/2022  . TETANUS/TDAP  08/29/2030  . Hepatitis C Screening  Completed  . PNA vac Low Risk Adult  Completed  . HPV VACCINES  Aged Out    Health Maintenance  Health Maintenance Due  Topic Date Due  . COVID-19 Vaccine (3 - Booster for Moderna series) 03/25/2020  . MAMMOGRAM  01/01/2021    Colorectal cancer screening: Type of screening: Colonoscopy. Completed 10/25/2012. Repeat every 10 years  Mammogram status: Completed 01/02/2020. Repeat every year  Bone Density status: Completed 01/02/2020. Results reflect: Bone density results: OSTEOPOROSIS. Repeat every 2 years.  Lung Cancer Screening: (Low Dose CT Chest recommended if Age 14-80 years, 30 pack-year currently smoking OR have quit w/in 15years.) does not qualify.   Lung Cancer Screening Referral: no  Additional Screening:  Hepatitis C Screening: does qualify; Completed yes  Vision Screening: Recommended annual ophthalmology exams for early detection of glaucoma and other disorders of the eye. Is the patient up to date with their annual eye exam?  Yes  Who is the provider or what is the name of the office in which the patient attends annual eye exams? Estill Cotta, MD. If pt is not established with a provider, would they like to be referred to a provider to establish care? No .   Dental Screening: Recommended annual dental exams for proper oral hygiene  Community Resource  Referral / Chronic Care Management: CRR required this visit?  No   CCM required this visit?  No      Plan:     I have personally reviewed and noted the following in the patient's chart:   . Medical and social history . Use of alcohol, tobacco or illicit drugs  . Current medications and supplements including opioid prescriptions.  . Functional ability and status . Nutritional status . Physical activity . Advanced directives . List of other physicians . Hospitalizations, surgeries, and ER visits in previous 12 months . Vitals . Screenings to include cognitive, depression, and falls . Referrals and appointments  In addition, I have reviewed and discussed with patient certain preventive protocols, quality metrics, and best practice recommendations. A written personalized care plan for preventive services as well as general preventive health recommendations were provided to patient.     Sheral Flow, LPN   1/32/4401   Nurse Notes:  Patient is cogitatively intact. There were no vitals filed for this visit. There is no height or weight on file to calculate BMI. Patient stated that she has no issues with gait or balance; does not use any assistive devices. Medications reviewed with patient; no opioid use noted.

## 2021-02-09 ENCOUNTER — Encounter: Payer: Self-pay | Admitting: Psychology

## 2021-02-09 ENCOUNTER — Ambulatory Visit (INDEPENDENT_AMBULATORY_CARE_PROVIDER_SITE_OTHER): Payer: PPO | Admitting: Psychology

## 2021-02-09 ENCOUNTER — Ambulatory Visit: Payer: PPO | Admitting: Psychology

## 2021-02-09 ENCOUNTER — Other Ambulatory Visit: Payer: Self-pay

## 2021-02-09 DIAGNOSIS — R4189 Other symptoms and signs involving cognitive functions and awareness: Secondary | ICD-10-CM

## 2021-02-09 DIAGNOSIS — G3184 Mild cognitive impairment, so stated: Secondary | ICD-10-CM

## 2021-02-09 DIAGNOSIS — F0670 Mild neurocognitive disorder due to known physiological condition without behavioral disturbance: Secondary | ICD-10-CM

## 2021-02-09 DIAGNOSIS — G309 Alzheimer's disease, unspecified: Secondary | ICD-10-CM | POA: Insufficient documentation

## 2021-02-09 HISTORY — DX: Mild neurocognitive disorder due to known physiological condition without behavioral disturbance: F06.70

## 2021-02-09 NOTE — Progress Notes (Signed)
NEUROPSYCHOLOGICAL EVALUATION [place] Department of Neurology  Date of Evaluation: February 09, 2021  Reason for Referral:   Madison Wright is a 75 y.o. right-handed Caucasian female referred by  Madison Wright, M.D. , to characterize her current cognitive functioning and assist with diagnostic clarity and treatment planning in the context of subjective cognitive decline.   Assessment and Plan:   Clinical Impression(s): Madison Wright pattern of performance is suggestive of prominent impairments surrounding all aspects of learning and memory. Additional impairments were exhibited across complex attention, phonemic fluency, and confrontation naming, while variability was exhibited across cognitive flexibility and visuospatial abilities. Performance was appropriate across processing speed, basic attention, receptive language, and semantic fluency. Regarding instrumental activities of daily living (ADLs), Madison Wright continues to drive and generally takes daily vitamins independently without significant issue. However, her husband did state that he has had to take a far more active role in financial management and bill paying due to suspected cognitive decline. Overall, I believe Madison Wright best meets criteria for a Mild Neurocognitive Disorder ("mild cognitive impairment") at the present time. However, she is likely towards the moderate-severe end of this spectrum and at risk for transitioning to a dementia designation in the future.   Regarding etiology, the most likely culprit for memory impairment and other cognitive dysfunction at the present time is Alzheimer's disease. Madison Wright was largely amnestic across assessed memory tasks and performed very poorly across yes/no recognition items. This suggests the presence of a memory storage deficit, which is the hallmark characteristic of this illness. Additional impairments across confrontation naming and variability across  visuospatial functioning and cognitive flexibility would be further consistent with this presentation. I do not have any neuroimaging at my disposal to assess any cerebrovascular contributions. However, Madison Wright is reportedly not taking any prescription medication and has no cardiovascular ailments listed in her medical history outside of "prediabetes," making this less likely. She does not demonstrate behavioral characteristics concerning for Lewy body dementia or frontotemporal dementia presentations at the present time. Continue medical monitoring will be important moving forward.   Recommendations: A repeat neuropsychological evaluation in 18 months (or sooner if functional decline is noted) is recommended to assess the trajectory of future cognitive decline should it occur. This will also aid in future efforts towards improved diagnostic clarity.  Madison Wright does not appear to be followed by Neurology at the present time. Given the extent of cognitive impairment and risk for future decline, I believe she would benefit from being followed by a neurologist. I will place a referral for her.   Madison Wright reported currently taking a vitamin supplement (Prevagen) to assist with memory. True peer-reviewed and replicated empirical research supporting the use of this supplement to improve memory functioning is essentially nonexistent. I would strongly recommend that she discuss prescription medication options (e.g., Aricept or Namenda) to address memory decline with both Madison Wright and her future neurologist. It is important to highlight that even these medications work only to slow down functional and cognitive decline. Unfortunately, there is no current treatment which can stop or reverse changes caused from Alzheimer's disease or other neurodegenerative conditions.   Should there be further progression of current deficits over time, Madison Wright is unlikely to regain any independent living skills lost.  Therefore, it is recommended that she remain as involved as possible in all aspects of household chores, finances, and medication management, with supervision to ensure adequate performance. She will likely benefit from the  establishment and maintenance of a routine in order to maximize her functional abilities over time.  It will be important for Madison Wright to have another Wright with her when in situations where she may need to process information, weigh the pros and cons of different options, and make decisions, in order to ensure that she fully understands and recalls all information to be considered.  If not already done, Madison Wright and her family may want to discuss her wishes regarding durable power of attorney and medical decision making, so that she can have input into these choices. Additionally, they may wish to discuss future plans for caretaking and seek out community options for in home/residential care should they become necessary.  Performance across neurocognitive testing is not a strong predictor of an individual's safety operating a motor vehicle. Should her family wish to pursue a formalized driving evaluation, they would be encouraged to contact [employer] in [place], [place] at [phone]. Another option would be through [facility]; however, the latter would likely require a referral from a medical doctor. [facility] can be reached directly at [phone].   Madison Wright is encouraged to attend to lifestyle factors for brain health (e.g., regular physical exercise, good nutrition habits, regular participation in cognitively-stimulating activities, and general stress management techniques), which are likely to have benefits for both emotional adjustment and cognition. Optimal control of vascular risk factors (including safe cardiovascular exercise and adherence to dietary recommendations) is encouraged. Continued participation in activities which provide  mental stimulation and social interaction is also recommended. I would also ensure that vitamin deficiencies are treated adequately as this can negatively influence cognitive functioning.   Important information to remember should be provided in written formats in all instances. This should be placed in a commonly frequented area of her residence to help promote recall.   To address problems with fluctuating attention and executive dysfunction, she may wish to consider:   -Avoiding external distractions when needing to concentrate   -Limiting exposure to fast paced environments with multiple sensory demands   -Writing down complicated information and using checklists   -Attempting and completing one task at a time (i.e., no multi-tasking)   -Verbalizing aloud each step of a task to maintain focus   -Taking frequent breaks during the completion of steps/tasks to avoid fatigue   -Reducing the amount of information considered at one time  Review of Records:   Ms. Karren was seen by [facility] Madison Wright, M.D.) on 11/06/2020 for her annual wellness exam. During that appointment, Ms. Sebring and her husband expressed concerns surrounding short-term memory. For example, Ms. Rogue has had trouble remembering upcoming appointments, as well as what days she works. Memory was said to have progressively worsened over time. No further details were provided. Ultimately, Ms. Riedlinger was referred for a comprehensive neuropsychological evaluation to characterize her cognitive abilities and to assist with diagnostic clarity and treatment planning.   No neuroimaging was available for review.   Past Medical History:  Diagnosis Date   Diverticulosis of colon    Diverticulosis of large intestine 01/07/2010   Granuloma annulare 2008   Menopausal state 11/2000   Took HRT X 10 days only  in 05/2001 secondary to [facility] breast ca   Osteoporosis    Post-menopausal atrophic vaginitis 07/2005   Prediabetes  11/20/2015   Vitamin B12 deficiency 11/06/2020   Vitamin D deficiency     Past Surgical History:  Procedure Laterality Date   COLONOSCOPY  2004  & 2014   diverticulosis, Dr Olevia Perches   SKIN CANCER EXCISION  2003   Basal skin   WRIST FRACTURE SURGERY  2012   [facility]   Current Outpatient Medications:    alendronate (FOSAMAX) 70 MG tablet, Take 1 tablet (70 mg total) by mouth every 7 (seven) days. Take with a full glass of water on an empty stomach. (Patient not taking: Reported on 01/08/2021), Disp: 4 tablet, Rfl: 11   Apoaequorin (PREVAGEN PO), Take by mouth., Disp: , Rfl:    Multiple Vitamin (MULTIVITAMIN) tablet, Take 1 tablet by mouth daily., Disp: , Rfl:    Probiotic Product (ALIGN PO), Take by mouth., Disp: , Rfl:   Clinical Interview:   The following information was obtained during a clinical interview with Ms. Roughton, her husband, and her sister prior to cognitive testing.  Cognitive Symptoms: Decreased short-term memory: Endorsed. Ms. Aerts reported generalized symptoms of short-term memory loss. Her husband and sister described her having trouble recalling the details of previous conversations, having trouble recalling names of family/friends who she does not see regularly, and her misplacing/losing things often. Her husband added that Ms. Fritts will often have to ask him if she works that day. Her sister stated that memory concerns have been present for the past three years and have progressively worsened over time.  Decreased long-term memory: Denied. Decreased attention/concentration: Endorsed. Her sister noted that Ms. Biegler will have some trouble focusing and seems to get distracted more easily than typical. At least some of this was attributed to acute anxiety surrounding cognitive dysfunction.  Reduced processing speed: Endorsed "a little bit."  Difficulties with executive functions: Endorsed. Ms. Roeder husband and sister described a notable change in her organizational  abilities, as well as increased indecisiveness. They denied trouble with impulsivity or any significant personality changes.  Difficulties with emotion regulation: Denied. Difficulties with receptive language: Denied. Difficulties with word finding: Denied. Decreased visuoperceptual ability: Denied.  Difficulties completing ADLs: Somewhat. Per her sister, Ms. Batley does not have prescription medications which she must take regularly. Her husband added that she does take some vitamins daily and will occasionally forget these. Her husband has assumed a far more active role in financial management and bill paying lately. While Ms. Alberts used to be heavily involved in his, he now performs all actions/management outside of writing checks. Ms. Dirusso does continue to drive without noted difficulty. Her sister reported one prior instance where she missed a turn and briefly got turned around.   Additional Medical History: History of traumatic brain injury/concussion: Denied. History of stroke: Denied. History of seizure activity: Denied. History of known exposure to toxins: Denied. Symptoms of chronic pain: Denied. Her sister noted a remote history of a left wrist fracture but Ms. Zaragosa did not endorse acute pain stemming from this event.  Experience of frequent headaches/migraines: Denied. Frequent instances of dizziness/vertigo: Denied.  Sensory changes: She wears glasses with positive effect. No other sensory changes/difficulties (e.g., hearing, taste, or smell) were reported.  Balance/coordination difficulties: Denied. She also denied any recent falls.  Other motor difficulties: Denied.  Sleep History: Estimated hours obtained each night: 7-8 hours.  Difficulties falling asleep: Denied. Difficulties staying asleep: Denied. Feels rested and refreshed upon awakening: Endorsed.  History of snoring: Denied. History of waking up gasping for air: Denied. Witnessed breath cessation while asleep:  Denied.  History of vivid dreaming: Denied. Excessive movement while asleep: Denied. Instances of acting out her dreams: Denied.  Psychiatric/Behavioral Health History: Depression: Overall, she  described her mood as "okay" and denied to her knowledge any prior mental health concerns or diagnoses. Acutely however, she did acknowledge some sadness surrounding the passing of her dog a few months prior. She briefly became tearful when discussing this during interview. Current or remote suicidal ideation, intent, or plan were denied.  Anxiety: More acutely, she and her sister reported ongoing anxiety specific to cognitive dysfunction. Her sister noted that the presence of anxiety symptoms often makes these difficulties worse. Outside of this, there was no report of a history of an anxiety disorder or more generalized symptoms.  Mania: Denied. Trauma History: Denied. Visual/auditory hallucinations: Denied. Delusional thoughts: Denied.  Tobacco: Denied. Alcohol: She denied current alcohol consumption as well as a history of problematic alcohol abuse or dependence.  Recreational drugs: Denied. Caffeine: She reported consuming occasional caffeinated sodas as well as some sweet tea.   Family History: Problem Relation Age of Onset   Arthritis Mother    Osteoporosis Mother        died after fall with fx. hips and PE   Stroke Mother        post immobilization with vertebral fracture   Migraines Mother    COPD Father    Colon cancer Paternal Aunt 43   Heart disease Paternal Aunt        X 2; both had MI > 74   Diabetes Paternal Aunt    Heart failure Paternal Grandmother    Cancer - Colon Unknown        nephew - chemo   This information was confirmed by Ms. Santelli and her sister.  Academic/Vocational History: Highest level of educational attainment: 12 years. 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 She reported being well-rounded and did  not describe any specific academic weaknesses.  History of developmental delay: Denied. History of grade repetition: Denied. Enrollment in special education courses: Denied. History of LD/ADHD: Denied.  Employment: She currently works about four hours per day, three days a week at [employer] and otherwise socializing with customers. She denied current memory concerns creating difficulties in this setting. 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   Evaluation Results:   Behavioral Observations: Ms. Spillane was accompanied by her husband and sister, arrived to her appointment on time, and was appropriately dressed and groomed. She appeared alert and oriented. Observed gait and station were within normal limits. Gross motor functioning appeared intact upon informal observation and no abnormal movements (e.g., tremors) were noted. Her affect was generally relaxed and positive, but did range appropriately given the subject being discussed during interview. Spontaneous speech was fluent and word finding difficulties were not observed during the clinical interview. Thought processes were coherent, organized, and normal in content. Insight into her cognitive difficulties appeared somewhat limited. She was aware of memory dysfunction, but I am unsure if she is aware of the full extent of said dysfunction. She also deferred to her husband and sister frequently during interview when discussing cognitive concerns held by them which she did not fully recognize.   During testing, task engagement was generally adequate. There was a moment where Ms. Rentz expressed a desire to discontinue testing during the early portions of a list learning task. However, it was emphasized by the psychometrist that incomplete memory testing would not be helpful to her or her doctors. She eventually agreed to  continue. As the evaluation  progressed, she expressed fatigue. The evaluation was subsequently abbreviated. Generally speaking, sustained attention was appropriate. She was noted to repeat story about a peacock in her neighbor's yard to the psychometrist a total of three times with no apparent appreciation that the story had been told previously. Overall, Ms. Leder was cooperative with the clinical interview and subsequent testing procedures.   Adequacy of Effort: The validity of neuropsychological testing is limited by the extent to which the individual being tested may be assumed to have exerted adequate effort during testing. Ms. Benningfield expressed her intention to perform to the best of her abilities and exhibited adequate task engagement and persistence. Scores across stand-alone and embedded performance validity measures were variable but generally within expectation. One below expectation performance was due to true amnestic memory performance rather than poor engagement or attempts to perform poorly. As such, the results of the current evaluation are believed to be a valid representation of Ms. Rigdon's current cognitive functioning.  Test Results: Ms. Nitsch was generally oriented at the time of the current evaluation. She was one day off when stating the current date and about two hours off when stating the current time.  Intellectual abilities based upon educational and vocational attainment were estimated to be in the average range. Premorbid abilities were estimated to be within the below average range based upon a single-word reading test.   Processing speed was below average to above average. Basic attention was above average to exceptionally high. More complex attention (e.g., working memory) was well below average to below average. Assessed executive functioning (i.e., cognitive flexibility) was variable, ranging from the well below average to average normative ranges. She did perform in the average range across a  task assessing safety and judgment.   Assessed receptive language abilities were average. Likewise, Ms. Dobberstein answered all questions asked of her appropriately during interview. Assessed expressive language was mildly variable. Phonemic fluency was exceptionally low, semantic fluency was below average, and confrontation naming was exceptionally low to well below average.     Assessed visuospatial/visuoconstructional abilities were exceptionally low outside of her copy of a complex figure. On her drawing of a clock, her first attempt was noteworthy for poor numerical spacing and her omitting the number 12. This was realized and she made a second attempt. However, her second attempt actually exhibited worse spacing where she was only able to include numbers 1-9. Hands were attempted on her first clock drawing; however, this represented a straight line between the numbers 10 and 2.    Learning (i.e., encoding) of novel verbal information was well below average. Spontaneous delayed recall (i.e., retrieval) of previously learned information was exceptionally low. Retention rates were 33% (raw score of 2) across a story learning task, 0% across a list learning task, and 0% across a figure drawing task. Performance across recognition tasks was exceptionally low to well below average, suggesting minimal evidence for information consolidation.   Results of emotional screening instruments suggested that recent symptoms of generalized anxiety were in the minimal range, while symptoms of depression were within normal limits. A screening instrument assessing recent sleep quality suggested the presence of minimal sleep dysfunction.  Tables of Scores:   Note: This summary of test scores accompanies the interpretive report and should not be considered in isolation without reference to the appropriate sections in the text. Descriptors are based on appropriate normative data and may be adjusted based on clinical  judgment. The terms "impaired" and "within normal limits (  WNL)" are used when a more specific level of functioning cannot be determined.       Validity Testing:   DESCRIPTOR       Dot Counting Test: --- --- Within Expectation  RBANS Effort Index: --- --- Below Expectation  WAIS-IV Reliable Digit Span: --- --- Within Expectation       Orientation:      Raw Score Percentile   NAB Orientation, Form 1 27/29 --- ---       Cognitive Screening:           Raw Score Percentile   SLUMS: 19/30 --- ---       RBANS, Form A: Standard Score/ Scaled Score Percentile   Total Score 63 1 Exceptionally Low  Immediate Memory 65 1 Exceptionally Low    List Learning 4 2 Well Below Average    Story Memory 4 2 Well Below Average  Visuospatial/Constructional 69 2 Exceptionally Low    Figure Copy 9 37 Average    Line Orientation 4/20 <2 Exceptionally Low  Language 71 3 Well Below Average    Picture Naming 8/10 3-9 Well Below Average    Semantic Fluency 5 5 Well Below Average  Attention 109 73 Average    Digit Span 16 98 Exceptionally High    Coding 7 16 Below Average  Delayed Memory 40 <1 Exceptionally Low    List Recall 0/10 <2 Exceptionally Low    List Recognition 10/20 <2 Exceptionally Low    Story Recall 3 1 Exceptionally Low    Story Recognition 6/12 3-4 Well Below Average    Figure Recall 1 <1 Exceptionally Low    Figure Recognition 2/8 2-3 Well Below Average       Intellectual Functioning:           Standard Score Percentile   Test of Premorbid Functioning: 85 16 Below Average       Attention/Executive Function:          Trail Making Test (TMT): Raw Score (T Score) Percentile     Part A 25 secs.,  0 errors (59) 82 Above Average    Part B 107 secs.,  1 error (45) 31 Average         Scaled Score Percentile   WAIS-IV Digit Span: 7 16 Below Average    Forward 12 75 Above Average    Backward 6 9 Below Average    Sequencing 4 2 Well Below Average       D-KEFS Verbal Fluency Test:  Raw Score (Scaled Score) Percentile     Letter Total Correct 15 (4) 2 Well Below Average    Category Total Correct 22 (6) 9 Below Average    Category Switching Total Correct 7 (4) 2 Well Below Average    Category Switching Accuracy 6 (5) 5 Well Below Average      Total Set Loss Errors 2 (10) 50 Average      Total Repetition Errors 8 (5) 5 Well Below Average       NAB Executive Functions Module, Form 1: T Score Percentile     Judgment 50 50 Average       Language:          Verbal Fluency Test: Raw Score (T Score) Percentile     Phonemic Fluency (FAS) 15 (29) 2 Exceptionally Low    Animal Fluency 12 (37) 9 Below Average        NAB Language Module, Form 1: T Score Percentile     Auditory  Comprehension 56 73 Average    Naming 24/31 (29) 2 Exceptionally Low       Visuospatial/Visuoconstruction:      Raw Score Percentile   Clock Drawing: 4/10 --- Impaired       Mood and Personality:      Raw Score Percentile   Geriatric Depression Scale: 1 --- Within Normal Limits  Geriatric Anxiety Scale: 2 --- Minimal    Somatic 1 --- Minimal    Cognitive 1 --- Minimal    Affective 0 --- Minimal       Additional Questionnaires:      Raw Score Percentile   PROMIS Sleep Disturbance Questionnaire: 9 --- None to Slight   Informed Consent and Coding/Compliance:   The current evaluation represents a clinical evaluation for the purposes previously outlined by the referral source and is in no way reflective of a forensic evaluation.   Ms. Shrieves was provided with a verbal description of the nature and purpose of the present neuropsychological evaluation. Also reviewed were the foreseeable risks and/or discomforts and benefits of the procedure, limits of confidentiality, and mandatory reporting requirements of this provider. The patient was given the opportunity to ask questions and receive answers about the evaluation. Oral consent to participate was provided by the patient.   This evaluation was  conducted by Christia Reading, Ph.D., licensed clinical neuropsychologist. Ms. Ottaway completed a clinical interview with Dr. Melvyn Novas, billed as one unit [id number], and 100 minutes of cognitive testing and scoring, billed as one unit [phone] and two additional units [id number]. Psychometrist Milana Kidney, B.S., assisted Dr. Melvyn Novas with test administration and scoring procedures. As a separate and discrete service, Dr. Melvyn Novas spent a total of 160 minutes in interpretation and report writing billed as one unit [phone] and two units [id number].

## 2021-02-09 NOTE — Progress Notes (Signed)
   Psychometrician Note   Cognitive testing was administered to Madison Wright by Milana Kidney, B.S. (psychometrist) under the supervision of Dr. Christia Reading, Ph.D., licensed psychologist on 02/09/21. Madison Wright did not appear overtly distressed by the testing session per behavioral observation or responses across self-report questionnaires. Rest breaks were offered.    The battery of tests administered was selected by Dr. Christia Reading, Ph.D. with consideration to Madison Wright's current level of functioning, the nature of her symptoms, emotional and behavioral responses during interview, level of literacy, observed level of motivation/effort, and the nature of the referral question. This battery was communicated to the psychometrist. Communication between Dr. Christia Reading, Ph.D. and the psychometrist was ongoing throughout the evaluation and Dr. Christia Reading, Ph.D. was immediately accessible at all times. Dr. Christia Reading, Ph.D. provided supervision to the psychometrist on the date of this service to the extent necessary to assure the quality of all services provided.    Madison Wright will return within approximately 1-2 weeks for an interactive feedback session with Dr. Melvyn Novas at which time her test performances, clinical impressions, and treatment recommendations will be reviewed in detail. Madison Wright understands she can contact our office should she require our assistance before this time.  A total of 100 minutes of billable time were spent face-to-face with Madison Wright by the psychometrist. This includes both test administration and scoring time. Billing for these services is reflected in the clinical report generated by Dr. Christia Reading, Ph.D.  This note reflects time spent with the psychometrician and does not include test scores or any clinical interpretations made by Dr. Melvyn Novas. The full report will follow in a separate note.

## 2021-02-11 ENCOUNTER — Encounter: Payer: Self-pay | Admitting: Internal Medicine

## 2021-02-16 ENCOUNTER — Other Ambulatory Visit: Payer: Self-pay

## 2021-02-16 ENCOUNTER — Ambulatory Visit (INDEPENDENT_AMBULATORY_CARE_PROVIDER_SITE_OTHER): Payer: PPO | Admitting: Psychology

## 2021-02-16 DIAGNOSIS — G309 Alzheimer's disease, unspecified: Secondary | ICD-10-CM

## 2021-02-16 DIAGNOSIS — F0670 Mild neurocognitive disorder due to known physiological condition without behavioral disturbance: Secondary | ICD-10-CM

## 2021-02-16 DIAGNOSIS — G3184 Mild cognitive impairment, so stated: Secondary | ICD-10-CM

## 2021-02-16 NOTE — Progress Notes (Signed)
   Neuropsychology Feedback Session Madison Wright. [facility]  Reason for Referral:   Madison Wright is a 75 y.o. right-handed Caucasian female referred by  Billey Gosling, M.D. , to characterize her current cognitive functioning and assist with diagnostic clarity and treatment planning in the context of subjective cognitive decline.  Feedback:   Madison Wright completed a comprehensive neuropsychological evaluation on 02/09/2021. Please refer to that encounter for the full report and recommendations. Briefly, results suggested prominent impairments surrounding all aspects of learning and memory. Additional impairments were exhibited across complex attention, phonemic fluency, and confrontation naming, while variability was exhibited across cognitive flexibility and visuospatial abilities. Regarding etiology, the most likely culprit for memory impairment and other cognitive dysfunction at the present time is Alzheimer's disease. Madison Wright was largely amnestic across assessed memory tasks and performed very poorly across yes/no recognition items. This suggests the presence of a memory storage deficit, which is the hallmark characteristic of this illness. Additional impairments across confrontation naming and variability across visuospatial functioning and cognitive flexibility would be further consistent with this presentation.  Madison Wright was accompanied by her husband and sister during the current feedback session. Content of the current session focused on the results of her neuropsychological evaluation. Madison Wright and her family were given the opportunity to ask questions and their questions were answered. They were encouraged to reach out should additional questions arise. A copy of her report was provided at the conclusion of the visit.      30 minutes were spent conducting the current feedback session with Madison Wright, billed as one unit [phone].

## 2021-02-26 ENCOUNTER — Telehealth: Payer: Self-pay | Admitting: Internal Medicine

## 2021-02-26 ENCOUNTER — Encounter: Payer: Self-pay | Admitting: Physician Assistant

## 2021-02-26 ENCOUNTER — Ambulatory Visit: Payer: PPO | Admitting: Physician Assistant

## 2021-02-26 ENCOUNTER — Other Ambulatory Visit: Payer: Self-pay

## 2021-02-26 VITALS — BP 143/81 | HR 79 | Ht 63.0 in | Wt 137.0 lb

## 2021-02-26 DIAGNOSIS — G309 Alzheimer's disease, unspecified: Secondary | ICD-10-CM

## 2021-02-26 DIAGNOSIS — G3184 Mild cognitive impairment, so stated: Secondary | ICD-10-CM

## 2021-02-26 MED ORDER — MEMANTINE HCL 5 MG PO TABS: ORAL_TABLET | ORAL | 11 refills | Status: DC

## 2021-02-26 NOTE — Telephone Encounter (Signed)
She needs to ideally start B12 injections monthly and start B12 1000 mcg daily (otc).  She had a low B12 after her last visit and we tried to get in touch with her, but were not able to - we mailed her a letter, but I do not think she is taking B12.    Low B12 can affect memory.      Please try to reach out to her - may need to discuss with her husband.

## 2021-02-26 NOTE — Progress Notes (Addendum)
Assessment/Plan:   Madison Wright is a 75 y.o. year old female with risk factors including  age, Vit D deficiency, Vit 12 deficiency, prediabetes, seen today for evaluation of memory loss.  Patient has a Neuropsychological exam by Dr. Melvyn Novas prior to this visit on 02/09/21, yielding a diagnosis of Mild Neurocognitive Disorder likely due to Alzheimer's Disease.      Recommendations are as follows:   Mild Neurocognitive Disorder likely due to Alzheimer's Disease  MRI of the brain to assess for underlying structural abnormality and assess vascular load. Repeat neurocognitive exam in about 18 months  Start Memantine  5 mg tablets.  Take  5 tablet at bedtime for 2 weeks, then 1 tablet twice daily.  Side effects were discussed, which include dizziness, headache, diarrhea or constipation.   Continue Vit B12 replenishment to maintain levels above 400. A message to PCP was sent to further monitor these levels Discussed safety both in and out of the home.  Discussed the importance of regular daily schedule with inclusion of crossword puzzles to maintain brain function.  Monitor mood with PCP.  Stay active at least 30 minutes at least 3 times a week.  Mediterranean diet is recommended  Folllow up in 6 months   Subjective:    The patient is seen in neurologic consultation at the request of [name], PhD for the evaluation of memory.  The patient is accompanied by husband [name] and her sister who supplement the history.  The patient is a 75 y.o. year old female who has had memory issues for about 4 years, at which time she began to notice difficulty keeping the date, and appointments.  Over the last 6 months, these symptoms have become worse, losing objects, or forgetting where she has left them.  Denies leaving these objects in unusual places.  The patient works at [employer], and her husband reports that at times she cannot remember if she has to go to work that day.  Because of these changes,  the patient occasionally becomes frustrated, and cries.  No frank depression or irritability is reported.  She sleeps well, and denies vivid dreams or sleepwalking.  She is independent of bathing and dressing.  She takes her own medications and places them in a pillbox.  Her husband is taking a more active role in the finances, as she was missing some payments.  She lost her checkbook, could not find it and had listed in a drawer all this time.  Her appetite has improved over the last 6 months after recovering from the loss of her dog.  She denies trouble swallowing, although she always had trouble swallowing pills.  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  She denies leaving the stove or the faucet on.  She continues to drive with GPS, and denies getting lost.  She ambulates without difficulty without the use of a walker or a cane.  She admits to not having enough exercise.  She denies headaches, trauma or injuries to the head, double vision, dizziness, focal numbness or tingling, unilateral weakness or hemorrhage.  Denies urine incontinence or retention.  She does have a history of intermittent diarrhea.  She denies constipation.  She denies anosmia.  Denies history of OSA, alcohol or tobacco.  Denies family history of dementia.       Neuropsych Evaluation, Dr. Melvyn Novas 02/09/21 "...Results suggested prominent impairments surrounding all aspects of learning and memory. Additional impairments were exhibited across complex attention,  phonemic fluency, and confrontation naming, while variability was exhibited across cognitive flexibility and visuospatial abilities. Regarding etiology, the most likely culprit for memory impairment and other cognitive dysfunction at the present time is Alzheimer's disease. Ms. Fellenz was largely amnestic across assessed memory tasks and performed very poorly across yes/no recognition items. This suggests the presence of a memory storage deficit, which  is the hallmark characteristic of this illness. Additional impairments across confrontation naming and variability across visuospatial functioning and cognitive flexibility would be further consistent with this presentation.Marland KitchenMarland KitchenA repeat neuropsychological evaluation in 18 months (or sooner if functional decline is noted) is recommended to assess the trajectory of future cognitive decline should it occur. This will also aid in future efforts towards improved diagnostic clarity."  Labs from 11/06/20 Vit B12 134 TSH   1.00 CBC and CMP unremarkable LDL 114 (mildly elevated) o/w nl    Allergies  Allergen Reactions   Sulfonamide Derivatives     rash   Codeine     Mood change ( "hyper")    Current Outpatient Medications  Medication Instructions   alendronate (FOSAMAX) 70 mg, Oral, Every 7 days, Take with a full glass of water on an empty stomach.   Apoaequorin (PREVAGEN PO) Oral   memantine (NAMENDA) 5 MG tablet Take 1 tablet at night for 2 weeks and then increase to 1 tablet twice a day   Multiple Vitamin (MULTIVITAMIN) tablet 1 tablet, Oral, Daily,     Probiotic Product (ALIGN PO) Oral     VITALS:   Vitals:   02/26/21 0827  BP: (!) 143/81  Pulse: 79  SpO2: 98%  Weight: 137 lb (62.1 kg)  Height: 5\' 3"  (1.6 m)   Depression screen [facility] 2/9 01/08/2021 08/29/2020 03/15/2019 08/31/2017 10/21/2015  Decreased Interest 0 0 0 0 0  Down, Depressed, Hopeless 0 0 0 0 0  PHQ - 2 Score 0 0 0 0 0    HEENT:  Normocephalic, atraumatic. The mucous membranes are moist. The superficial temporal arteries are without ropiness or tenderness. Cardiovascular: Regular rate and rhythm. Lungs: Clear to auscultation bilaterally. Neck: There are no carotid bruits noted bilaterally.  NEUROLOGICAL:  Orientation:  No flowsheet data found. Alert and oriented to person, place and time. No aphasia or dysarthria. Fund of knowledge is appropriate. Recent memory impaired and remote memory intact.  Attention and  concentration are normal.  Able to name objects and repeat phrases.   Cranial nerves: There is good facial symmetry. Extraocular muscles are intact and visual fields are full to confrontational testing. Speech is fluent and clear. Soft palate rises symmetrically and there is no tongue deviation. Hearing is intact to conversational tone. Tone: Tone is good throughout. Sensation: Sensation is intact to light touch and pinprick throughout. Vibration is intact at the bilateral big toe.There is no extinction with double simultaneous stimulation. There is no sensory dermatomal level identified. Coordination: The patient has no difficulty with RAM's or FNF bilaterally. Normal finger to nose  Motor: Strength is 5/5 in the bilateral upper and lower extremities. There is no pronator drift. There are no fasciculations noted. DTR's: Deep tendon reflexes are 2/4 at the bilateral biceps, triceps, brachioradialis, patella and achilles.  Plantar responses are downgoing bilaterally. Gait and Station: The patient is able to ambulate without difficulty.The patient is able to heel toe walk without any difficulty.The patient is able to ambulate in a tandem fashion. The patient is able to stand in the Romberg position.   CBC Latest Ref Rng & Units 03/15/2019 08/31/2017  11/19/2015  WBC 4.0 - 10.5 K/uL 8.0 8.0 6.8  Hemoglobin 12.0 - 15.0 g/dL 13.3 13.9 13.3  Hematocrit 36.0 - 46.0 % 40.9 42.4 40.6  Platelets 150.0 - 400.0 K/uL 328.0 354.0 316.0     CMP Latest Ref Rng & Units 11/06/2020 03/15/2019 08/31/2017  Glucose 70 - 99 mg/dL 101(H) 100(H) 112(H)  BUN 6 - 23 mg/dL 8 12 10   Creatinine 0.40 - 1.20 mg/dL 0.56 0.61 0.54  Sodium 135 - 145 mEq/L 140 139 138  Potassium 3.5 - 5.1 mEq/L 3.8 3.9 4.1  Chloride 96 - 112 mEq/L 102 103 100  CO2 19 - 32 mEq/L 30 27 30   Calcium 8.4 - 10.5 mg/dL 9.3 9.5 9.4  Total Protein 6.0 - 8.3 g/dL 6.9 7.0 7.1  Total Bilirubin 0.2 - 1.2 mg/dL 0.8 0.8 0.6  Alkaline Phos 39 - 117 U/L 94 90 84   AST 0 - 37 U/L 19 30 23   ALT 0 - 35 U/L 9 16 14       Thank you for allowing [place] the opportunity to participate in the care of this nice patient. Please do not hesitate to contact us for any questions or concerns.   Total time spent on today's visit was 50 minutes, including both face-to-face time and nonface-to-face time.  Time included that spent on review of records (prior notes available to me/labs/imaging if pertinent), discussing treatment and goals, answering patient's questions and coordinating care.  Cc:  Binnie Rail, MD  Sharene Butters 02/26/2021 11:24 AM

## 2021-02-26 NOTE — Patient Instructions (Addendum)
It was a pleasure to see you today at our office.   Recommendations:  Start Memantine  5 mg tablets.  Take 1 tablet at night for 2 weeks then increase to 1 tablet twice daily   Side effects include dizziness, headache,  mild diarrhea or constipation.  Call with any questions or concerns.   MRI of the brain Continue Vit B 12 replenishment to maintain levels above 400 . Will inform your primary doctor  Follow up in 6 months   RECOMMENDATIONS FOR ALL PATIENTS WITH MEMORY PROBLEMS: 1. Continue to exercise (Recommend 30 minutes of walking everyday, or 3 hours every week) 2. Increase social interactions - continue going to [place] and enjoy social gatherings with friends and family 3. Eat healthy, avoid fried foods and eat more fruits and vegetables 4. Maintain adequate blood pressure, blood sugar, and blood cholesterol level. Reducing the risk of stroke and cardiovascular disease also helps promoting better memory. 5. Avoid stressful situations. Live a simple life and avoid aggravations. Organize your time and prepare for the next day in anticipation. 6. Sleep well, avoid any interruptions of sleep and avoid any distractions in the bedroom that may interfere with adequate sleep quality 7. Avoid sugar, avoid sweets as there is a strong link between excessive sugar intake, diabetes, and cognitive impairment We discussed the Mediterranean diet, which has been shown to help patients reduce the risk of progressive memory disorders and reduces cardiovascular risk. This includes eating fish, eat fruits and green leafy vegetables, nuts like almonds and hazelnuts, walnuts, and also use olive oil. Avoid fast foods and fried foods as much as possible. Avoid sweets and sugar as sugar use has been linked to worsening of memory function.  There is always a concern of gradual progression of memory problems. If this is the case, then we may need to adjust level of care according to patient needs. Support, both to the  patient and caregiver, should then be put into place.    The Alzheimer's Association is here all day, every day for people facing Alzheimer's disease through our free 24/7 Helpline: [phone] [phone]. The Helpline provides reliable information and support to all those who need assistance, such as individuals living with memory loss, Alzheimer's or other dementia, caregivers, health care professionals and the public.  Our highly trained and knowledgeable staff can help you with: Understanding memory loss, dementia and Alzheimer's  Medications and other treatment options  General information about aging and brain health  Skills to provide quality care and to find the best care from professionals  Legal, financial and living-arrangement decisions Our Helpline also features: Confidential care consultation provided by master's level clinicians who can help with decision-making support, crisis assistance and education on issues families face every day  Help in a caller's preferred language using our translation service that features more than 200 languages and dialects  Referrals to local community programs, services and ongoing support     FALL PRECAUTIONS: Be cautious when walking. Scan the area for obstacles that may increase the risk of trips and falls. When getting up in the mornings, sit up at the edge of the bed for a few minutes before getting out of bed. Consider elevating the bed at the head end to avoid drop of blood pressure when getting up. Walk always in a well-lit room (use night lights in the walls). Avoid area rugs or power cords from appliances in the middle of the walkways. Use a walker or a cane if necessary and consider physical  therapy for balance exercise. Get your eyesight checked regularly.  FINANCIAL OVERSIGHT: Supervision, especially oversight when making financial decisions or transactions is also recommended.  HOME SAFETY: Consider the safety of the kitchen when operating  appliances like stoves, microwave oven, and blender. Consider having supervision and share cooking responsibilities until no longer able to participate in those. Accidents with firearms and other hazards in the house should be identified and addressed as well.   ABILITY TO BE LEFT ALONE: If patient is unable to contact 911 operator, consider using LifeLine, or when the need is there, arrange for someone to stay with patients. Smoking is a fire hazard, consider supervision or cessation. Risk of wandering should be assessed by caregiver and if detected at any point, supervision and safe proof recommendations should be instituted.  MEDICATION SUPERVISION: Inability to self-administer medication needs to be constantly addressed. Implement a mechanism to ensure safe administration of the medications.   DRIVING: Regarding driving, in patients with progressive memory problems, driving will be impaired. We advise to have someone else do the driving if trouble finding directions or if minor accidents are reported. Independent driving assessment is available to determine safety of driving.   If you are interested in the driving assessment, you can contact the following:  [employer] in [place]  [place] [phone] or [phone]      [place] refers to food and lifestyle choices that are based on the traditions of countries located on the [employer]. This way of eating has been shown to help prevent certain conditions and improve outcomes for people who have chronic diseases, like kidney disease and heart disease. What are tips for following this plan? Lifestyle  Cook and eat meals together with your family, when possible. Drink enough fluid to keep your urine clear or pale yellow. Be physically active every day. This includes: Aerobic  exercise like running or swimming. Leisure activities like gardening, walking, or housework. Get 7-8 hours of sleep each night. If recommended by your health care provider, drink red wine in moderation. This means 1 glass a day for nonpregnant women and 2 glasses a day for men. A glass of wine equals 5 oz (150 mL). Reading food labels  Check the serving size of packaged foods. For foods such as rice and pasta, the serving size refers to the amount of cooked product, not dry. Check the total fat in packaged foods. Avoid foods that have saturated fat or trans fats. Check the ingredients list for added sugars, such as corn syrup. Shopping  At the grocery store, buy most of your food from the areas near the walls of the store. This includes: Fresh fruits and vegetables (produce). Grains, beans, nuts, and seeds. Some of these may be available in unpackaged forms or large amounts (in bulk). Fresh seafood. Poultry and eggs. Low-fat dairy products. Buy whole ingredients instead of prepackaged foods. Buy fresh fruits and vegetables in-season from local farmers markets. Buy frozen fruits and vegetables in resealable bags. If you do not have access to quality fresh seafood, buy precooked frozen shrimp or canned fish, such as tuna, salmon, or sardines. Buy small amounts of raw or cooked vegetables, salads, or olives from the deli or salad bar at your store. Stock your pantry so you always have certain foods on hand, such as olive oil, canned tuna, canned tomatoes, rice, pasta, and beans. Cooking  Cook foods with extra-virgin  olive oil instead of using butter or other vegetable oils. Have meat as a side dish, and have vegetables or grains as your main dish. This means having meat in small portions or adding small amounts of meat to foods like pasta or stew. Use beans or vegetables instead of meat in common dishes like chili or lasagna. Experiment with different cooking methods. Try roasting or broiling  vegetables instead of steaming or sauteing them. Add frozen vegetables to soups, stews, pasta, or rice. Add nuts or seeds for added healthy fat at each meal. You can add these to yogurt, salads, or vegetable dishes. Marinate fish or vegetables using olive oil, lemon juice, garlic, and fresh herbs. Meal planning  Plan to eat 1 vegetarian meal one day each week. Try to work up to 2 vegetarian meals, if possible. Eat seafood 2 or more times a week. Have healthy snacks readily available, such as: Vegetable sticks with hummus. Greek yogurt. Fruit and nut trail mix. Eat balanced meals throughout the week. This includes: Fruit: 2-3 servings a day Vegetables: 4-5 servings a day Low-fat dairy: 2 servings a day Fish, poultry, or lean meat: 1 serving a day Beans and legumes: 2 or more servings a week Nuts and seeds: 1-2 servings a day Whole grains: 6-8 servings a day Extra-virgin olive oil: 3-4 servings a day Limit red meat and sweets to only a few servings a month What are my food choices? Mediterranean diet Recommended Grains: Whole-grain pasta. Brown rice. Bulgar wheat. Polenta. Couscous. Whole-wheat bread. [place]. Vegetables: Artichokes. Beets. Broccoli. Cabbage. Carrots. Eggplant. Green beans. Chard. Kale. Spinach. Onions. Leeks. Peas. Squash. Tomatoes. Peppers. Radishes. Fruits: Apples. Apricots. Avocado. Berries. Bananas. Cherries. Dates. Figs. Grapes. Lemons. Melon. Oranges. Peaches. Plums. Pomegranate. Meats and other protein foods: Beans. Almonds. Sunflower seeds. Pine nuts. Peanuts. [place]. Salmon. Scallops. Shrimp. [place]. Tilapia. Clams. Oysters. Eggs. Dairy: Low-fat milk. Cheese. Greek yogurt. Beverages: Water. Red wine. Herbal tea. Fats and oils: Extra virgin olive oil. Avocado oil. Grape seed oil. Sweets and desserts: [place] yogurt with honey. Baked apples. Poached pears. Trail mix. Seasoning and other foods: Basil. Cilantro. Coriander. Cumin. Mint. Parsley. Sage. Rosemary.  Tarragon. Garlic. Oregano. Thyme. Pepper. Balsalmic vinegar. Tahini. Hummus. Tomato sauce. Olives. Mushrooms. Limit these Grains: Prepackaged pasta or rice dishes. Prepackaged cereal with added sugar. Vegetables: Deep fried potatoes (french fries). Fruits: Fruit canned in syrup. Meats and other protein foods: Beef. Pork. Lamb. Poultry with skin. Hot dogs. Berniece Salines. Dairy: Ice cream. Sour cream. Whole milk. Beverages: Juice. Sugar-sweetened soft drinks. Beer. Liquor and spirits. Fats and oils: Butter. Canola oil. Vegetable oil. Beef fat (tallow). Lard. Sweets and desserts: Cookies. Cakes. Pies. Candy. Seasoning and other foods: Mayonnaise. Premade sauces and marinades. The items listed may not be a complete list. Talk with your dietitian about what dietary choices are right for you. Summary The Mediterranean diet includes both food and lifestyle choices. Eat a variety of fresh fruits and vegetables, beans, nuts, seeds, and whole grains. Limit the amount of red meat and sweets that you eat. Talk with your health care provider about whether it is safe for you to drink red wine in moderation. This means 1 glass a day for nonpregnant women and 2 glasses a day for men. A glass of wine equals 5 oz (150 mL). This information is not intended to replace advice given to you by your health care provider. Make sure you discuss any questions you have with your health care provider. Document Released: 04/01/2016 Document Revised: 05/04/2016 Document Reviewed:  04/01/2016 Elsevier Interactive Patient Education  2017 [employer].

## 2021-02-27 NOTE — Telephone Encounter (Signed)
Spoke with [name] and appointment made for Monday for first monthly B12.

## 2021-03-02 ENCOUNTER — Ambulatory Visit: Payer: PPO

## 2021-03-10 ENCOUNTER — Ambulatory Visit
Admission: RE | Admit: 2021-03-10 | Discharge: 2021-03-10 | Disposition: A | Discharge status: Home / Self Care | Payer: PPO | Source: Ambulatory Visit | Attending: Physician Assistant | Admitting: Physician Assistant

## 2021-03-10 DIAGNOSIS — G309 Alzheimer's disease, unspecified: Secondary | ICD-10-CM

## 2021-03-10 DIAGNOSIS — F0390 Unspecified dementia without behavioral disturbance: Secondary | ICD-10-CM | POA: Diagnosis not present

## 2021-03-10 DIAGNOSIS — G319 Degenerative disease of nervous system, unspecified: Secondary | ICD-10-CM | POA: Diagnosis not present

## 2021-03-10 DIAGNOSIS — I6782 Cerebral ischemia: Secondary | ICD-10-CM | POA: Diagnosis not present

## 2021-03-11 ENCOUNTER — Telehealth: Payer: Self-pay

## 2021-03-11 NOTE — Telephone Encounter (Signed)
Per noted result noted from 11/06/20 labs & phone encounter on 02/26/21: She needs to ideally start B12 injections monthly and start B12 1000 mcg daily (otc).

## 2021-03-12 ENCOUNTER — Other Ambulatory Visit: Payer: Self-pay

## 2021-03-12 ENCOUNTER — Telehealth: Payer: Self-pay

## 2021-03-12 ENCOUNTER — Ambulatory Visit (INDEPENDENT_AMBULATORY_CARE_PROVIDER_SITE_OTHER): Payer: PPO

## 2021-03-12 DIAGNOSIS — E538 Deficiency of other specified B group vitamins: Secondary | ICD-10-CM | POA: Diagnosis not present

## 2021-03-12 MED ORDER — CYANOCOBALAMIN 1000 MCG/ML IJ SOLN
1000.0000 ug | INTRAMUSCULAR | Status: AC
  Administered 2021-03-12 – 2021-10-07 (×4): 1000 ug via INTRAMUSCULAR

## 2021-03-12 NOTE — Progress Notes (Signed)
Pt here for monthly B12 injection per Dr Quay Burow.  B12 1038mcg given IM right deltoid and pt tolerated injection well.  Next B12 injection scheduled for 04/13/21.  Dr Mitchel Honour: can you cosign since PCP is out of office?  Thanks!

## 2021-03-12 NOTE — Telephone Encounter (Signed)
Spoke with pt sister informed her MRI is only remarkable for age related changes  that may explain memory loss,and chronic disease of the vessels, but nothing acute

## 2021-03-12 NOTE — Telephone Encounter (Signed)
error 

## 2021-03-12 NOTE — Telephone Encounter (Signed)
-----   Message from Rondel Jumbo, PA-C sent at 03/11/2021  9:38 AM EDT ----- PLease inform patient that MRI is only remarkable for age related changes  that may explain memory loss, and chronic disease of the vessels, but nothing acute. Thanks

## 2021-04-16 ENCOUNTER — Ambulatory Visit (INDEPENDENT_AMBULATORY_CARE_PROVIDER_SITE_OTHER): Payer: PPO

## 2021-04-16 ENCOUNTER — Other Ambulatory Visit: Payer: Self-pay

## 2021-04-16 DIAGNOSIS — E538 Deficiency of other specified B group vitamins: Secondary | ICD-10-CM | POA: Diagnosis not present

## 2021-04-16 NOTE — Progress Notes (Signed)
Pt here for monthly B12 injection per Dr Quay Burow.  B12 1074mg given IM right deltoid and pt tolerated injection well.  Next B12 injection scheduled for 05/21/21.

## 2021-05-21 ENCOUNTER — Ambulatory Visit (INDEPENDENT_AMBULATORY_CARE_PROVIDER_SITE_OTHER): Payer: PPO

## 2021-05-21 ENCOUNTER — Other Ambulatory Visit: Payer: Self-pay

## 2021-05-21 DIAGNOSIS — E538 Deficiency of other specified B group vitamins: Secondary | ICD-10-CM

## 2021-05-21 NOTE — Progress Notes (Signed)
Pt here for monthly B12 injection per Dr. Quay Burow  B12 101mcg given IM, and pt tolerated injection well.  Next B12 injection scheduled for 06/18/21.

## 2021-06-18 ENCOUNTER — Ambulatory Visit (INDEPENDENT_AMBULATORY_CARE_PROVIDER_SITE_OTHER): Payer: PPO

## 2021-06-18 ENCOUNTER — Other Ambulatory Visit: Payer: Self-pay

## 2021-06-18 DIAGNOSIS — Z23 Encounter for immunization: Secondary | ICD-10-CM | POA: Diagnosis not present

## 2021-06-18 DIAGNOSIS — E538 Deficiency of other specified B group vitamins: Secondary | ICD-10-CM | POA: Diagnosis not present

## 2021-06-18 MED ORDER — CYANOCOBALAMIN 1000 MCG/ML IJ SOLN
1000.0000 ug | Freq: Once | INTRAMUSCULAR | Status: AC
  Administered 2021-06-18: 1000 ug via INTRAMUSCULAR

## 2021-06-18 NOTE — Progress Notes (Signed)
Pt given O48 w/o any complications and HD flu vacc.

## 2021-07-21 ENCOUNTER — Telehealth: Payer: Self-pay | Admitting: Internal Medicine

## 2021-07-21 NOTE — Telephone Encounter (Signed)
Patient sister [name] calling in  Patient was scheduled for B12 nurse visit 12/01 but had to cancel bc patient husband is currently having cancer treatments & is not able to bring patient  Sister [name] wants to know if there is anyway possible B12 injections can be given locally somewhere in [place] since patient is currently not able to get reliable transportation to office  Please all [name] [phone]

## 2021-07-23 ENCOUNTER — Ambulatory Visit: Payer: PPO

## 2021-07-23 NOTE — Telephone Encounter (Signed)
Message left for Madison Wright.  If there is someone who is certified to give her the shot at home we can have her do it that way.  If not she would need to come in for the B12 shots as we don't have a location that we could send her to just to get the B12 shots.

## 2021-07-31 ENCOUNTER — Ambulatory Visit (INDEPENDENT_AMBULATORY_CARE_PROVIDER_SITE_OTHER): Payer: PPO | Admitting: *Deleted

## 2021-07-31 ENCOUNTER — Other Ambulatory Visit: Payer: Self-pay

## 2021-07-31 DIAGNOSIS — E538 Deficiency of other specified B group vitamins: Secondary | ICD-10-CM

## 2021-07-31 MED ORDER — CYANOCOBALAMIN 1000 MCG/ML IJ SOLN
1000.0000 ug | Freq: Once | INTRAMUSCULAR | Status: AC
  Administered 2021-07-31: 1000 ug via INTRAMUSCULAR

## 2021-07-31 NOTE — Progress Notes (Signed)
Pls cosign for B12 inj../lmb  

## 2021-08-18 DIAGNOSIS — H2513 Age-related nuclear cataract, bilateral: Secondary | ICD-10-CM | POA: Diagnosis not present

## 2021-08-30 ENCOUNTER — Other Ambulatory Visit: Payer: Self-pay | Admitting: Physician Assistant

## 2021-09-01 ENCOUNTER — Ambulatory Visit: Payer: PPO | Admitting: Physician Assistant

## 2021-09-02 ENCOUNTER — Ambulatory Visit: Payer: PPO

## 2021-09-23 ENCOUNTER — Ambulatory Visit: Payer: PPO

## 2021-09-30 ENCOUNTER — Other Ambulatory Visit: Payer: Self-pay | Admitting: Physician Assistant

## 2021-10-07 ENCOUNTER — Other Ambulatory Visit: Payer: Self-pay

## 2021-10-07 ENCOUNTER — Ambulatory Visit (INDEPENDENT_AMBULATORY_CARE_PROVIDER_SITE_OTHER): Payer: PPO

## 2021-10-07 DIAGNOSIS — E538 Deficiency of other specified B group vitamins: Secondary | ICD-10-CM | POA: Diagnosis not present

## 2021-10-07 NOTE — Progress Notes (Signed)
B12 given. Please co-sign.  Aalliyah Kilker, CMA 

## 2021-11-05 ENCOUNTER — Other Ambulatory Visit: Payer: Self-pay

## 2021-11-05 ENCOUNTER — Ambulatory Visit (INDEPENDENT_AMBULATORY_CARE_PROVIDER_SITE_OTHER): Payer: PPO

## 2021-11-05 DIAGNOSIS — E538 Deficiency of other specified B group vitamins: Secondary | ICD-10-CM | POA: Diagnosis not present

## 2021-11-05 MED ORDER — CYANOCOBALAMIN 1000 MCG/ML IJ SOLN
1000.0000 ug | Freq: Once | INTRAMUSCULAR | Status: AC
  Administered 2021-11-05: 1000 ug via INTRAMUSCULAR

## 2021-11-05 NOTE — Progress Notes (Signed)
Pt here for monthly B12 injection  ? ?B12 1065mg given IM, and pt tolerated injection well. ? ?Next B12 injection scheduled for 12/10/21 ? ?

## 2021-11-07 ENCOUNTER — Other Ambulatory Visit: Payer: Self-pay | Admitting: Physician Assistant

## 2021-11-11 ENCOUNTER — Encounter: Payer: Self-pay | Admitting: Internal Medicine

## 2021-11-11 NOTE — Patient Instructions (Addendum)
? ? ? ?Blood work was ordered.   ? ? ?Medications changes include :   restart alendronate 70 mg weekly for your bone.  ? ? ?Your prescription(s) have been sent to your pharmacy.  ? ? ? ?Return in about 1 year (around 11/13/2022) for CPE. ? ? ? ?Health Maintenance, Female ?Adopting a healthy lifestyle and getting preventive care are important in promoting health and wellness. Ask your health care provider about: ?The right schedule for you to have regular tests and exams. ?Things you can do on your own to prevent diseases and keep yourself healthy. ?What should I know about diet, weight, and exercise? ?Eat a healthy diet ? ?Eat a diet that includes plenty of vegetables, fruits, low-fat dairy products, and lean protein. ?Do not eat a lot of foods that are high in solid fats, added sugars, or sodium. ?Maintain a healthy weight ?Body mass index (BMI) is used to identify weight problems. It estimates body fat based on height and weight. Your health care provider can help determine your BMI and help you achieve or maintain a healthy weight. ?Get regular exercise ?Get regular exercise. This is one of the most important things you can do for your health. Most adults should: ?Exercise for at least 150 minutes each week. The exercise should increase your heart rate and make you sweat (moderate-intensity exercise). ?Do strengthening exercises at least twice a week. This is in addition to the moderate-intensity exercise. ?Spend less time sitting. Even light physical activity can be beneficial. ?Watch cholesterol and blood lipids ?Have your blood tested for lipids and cholesterol at 74 years of age, then have this test every 5 years. ?Have your cholesterol levels checked more often if: ?Your lipid or cholesterol levels are high. ?You are older than 74 years of age. ?You are at high risk for heart disease. ?What should I know about cancer screening? ?Depending on your health history and family history, you may need to have cancer  screening at various ages. This may include screening for: ?Breast cancer. ?Cervical cancer. ?Colorectal cancer. ?Skin cancer. ?Lung cancer. ?What should I know about heart disease, diabetes, and high blood pressure? ?Blood pressure and heart disease ?High blood pressure causes heart disease and increases the risk of stroke. This is more likely to develop in people who have high blood pressure readings or are overweight. ?Have your blood pressure checked: ?Every 3-5 years if you are 83-73 years of age. ?Every year if you are 74 years old or older. ?Diabetes ?Have regular diabetes screenings. This checks your fasting blood sugar level. Have the screening done: ?Once every three years after age 31 if you are at a normal weight and have a low risk for diabetes. ?More often and at a younger age if you are overweight or have a high risk for diabetes. ?What should I know about preventing infection? ?Hepatitis B ?If you have a higher risk for hepatitis B, you should be screened for this virus. Talk with your health care provider to find out if you are at risk for hepatitis B infection. ?Hepatitis C ?Testing is recommended for: ?Everyone born from [birth year] through 1963. ?Anyone with known risk factors for hepatitis C. ?Sexually transmitted infections (STIs) ?Get screened for STIs, including gonorrhea and chlamydia, if: ?You are sexually active and are younger than 74 years of age. ?You are older than 74 years of age and your health care provider tells you that you are at risk for this type of infection. ?Your sexual activity has changed  since you were last screened, and you are at increased risk for chlamydia or gonorrhea. Ask your health care provider if you are at risk. ?Ask your health care provider about whether you are at high risk for HIV. Your health care provider may recommend a prescription medicine to help prevent HIV infection. If you choose to take medicine to prevent HIV, you should first get tested for HIV. You  should then be tested every 3 months for as long as you are taking the medicine. ?Pregnancy ?If you are about to stop having your period (premenopausal) and you may become pregnant, seek counseling before you get pregnant. ?Take 400 to 800 micrograms (mcg) of folic acid every day if you become pregnant. ?Ask for birth control (contraception) if you want to prevent pregnancy. ?Osteoporosis and menopause ?Osteoporosis is a disease in which the bones lose minerals and strength with aging. This can result in bone fractures. If you are 37 years old or older, or if you are at risk for osteoporosis and fractures, ask your health care provider if you should: ?Be screened for bone loss. ?Take a calcium or vitamin D supplement to lower your risk of fractures. ?Be given hormone replacement therapy (HRT) to treat symptoms of menopause. ?Follow these instructions at home: ?Alcohol use ?Do not drink alcohol if: ?Your health care provider tells you not to drink. ?You are pregnant, may be pregnant, or are planning to become pregnant. ?If you drink alcohol: ?Limit how much you have to: ?0-1 drink a day. ?Know how much alcohol is in your drink. In the [place] one drink equals one 12 oz bottle of beer (355 mL), one 5 oz glass of wine (148 mL), or one 1? oz glass of hard liquor (44 mL). ?Lifestyle ?Do not use any products that contain nicotine or tobacco. These products include cigarettes, chewing tobacco, and vaping devices, such as e-cigarettes. If you need help quitting, ask your health care provider. ?Do not use street drugs. ?Do not share needles. ?Ask your health care provider for help if you need support or information about quitting drugs. ?General instructions ?Schedule regular health, dental, and eye exams. ?Stay current with your vaccines. ?Tell your health care provider if: ?You often feel depressed. ?You have ever been abused or do not feel safe at home. ?Summary ?Adopting a healthy lifestyle and getting preventive care are  important in promoting health and wellness. ?Follow your health care provider's instructions about healthy diet, exercising, and getting tested or screened for diseases. ?Follow your health care provider's instructions on monitoring your cholesterol and blood pressure. ?This information is not intended to replace advice given to you by your health care provider. Make sure you discuss any questions you have with your health care provider. ?Document Revised: 12/29/2020 Document Reviewed: 12/29/2020 ?Elsevier Patient Education ? [employer]. ? ?

## 2021-11-11 NOTE — Progress Notes (Signed)
? ? ?Subjective:  ? ? Patient ID: Madison Wright, female    DOB: 1946/04/10, 76 y.o.   MRN: [id number] ? ? ?This visit occurred during the SARS-CoV-2 public health emergency.  Safety protocols were in place, including screening questions prior to the visit, additional usage of staff PPE, and extensive cleaning of exam room while observing appropriate contact time as indicated for disinfecting solutions. ? ? ? ?HPI ?Madison Wright is here for  ?Chief Complaint  ?Patient presents with  ? Annual Exam  ? ? ?She is active. She is still working at [employer].   ? ?She has no concerns. ? ? ? ?Medications and allergies reviewed with patient and updated if appropriate. ? ? ? ?Current Outpatient Medications on File Prior to Visit  ?Medication Sig Dispense Refill  ? alendronate (FOSAMAX) 70 MG tablet Take 1 tablet (70 mg total) by mouth every 7 (seven) days. Take with a full glass of water on an empty stomach. 4 tablet 11  ? memantine (NAMENDA) 5 MG tablet TAKE 1 TABLET BY MOUTH AT NIGHT X 2 WEEKS. THEN 1 TABLET TWICE A DAY 60 tablet 0  ? Multiple Vitamin (MULTIVITAMIN) tablet Take 1 tablet by mouth daily.    ? Probiotic Product (ALIGN PO) Take by mouth.    ? ?Current Facility-Administered Medications on File Prior to Visit  ?Medication Dose Route Frequency Provider Last Rate Last Admin  ? cyanocobalamin ((VITAMIN B-12)) injection 1,000 mcg  1,000 mcg Intramuscular Q30 days Binnie Rail, MD   1,000 mcg at 10/07/21 1131  ? ? ?Review of Systems  ?Constitutional:  Negative for appetite change and fever.  ?Eyes:  Negative for visual disturbance.  ?Respiratory:  Negative for cough, shortness of breath and wheezing.   ?Cardiovascular:  Negative for chest pain, palpitations and leg swelling.  ?Gastrointestinal:  Negative for abdominal pain, blood in stool, constipation, diarrhea and nausea.  ?Genitourinary:  Negative for dysuria.  ?Musculoskeletal:  Negative for arthralgias and back pain.  ?Skin:  Negative for rash.  ?Neurological:  Negative  for dizziness, light-headedness and headaches.  ?Psychiatric/Behavioral:  Negative for dysphoric mood and sleep disturbance. The patient is not nervous/anxious.   ? ?   ?Objective:  ? ?Vitals:  ? 11/12/21 0807  ?BP: (!) 144/72  ?Pulse: 73  ?Temp: 98.2 ?F (36.8 ?C)  ?SpO2: 98%  ? ?Filed Weights  ? 11/12/21 0807  ?Weight: 132 lb 3.2 oz (60 kg)  ? ?Body mass index is 23.42 kg/m?. ? ?BP Readings from Last 3 Encounters:  ?11/12/21 (!) 144/72  ?02/26/21 (!) 143/81  ?11/06/20 138/84  ? ? ?Wt Readings from Last 3 Encounters:  ?11/12/21 132 lb 3.2 oz (60 kg)  ?02/26/21 137 lb (62.1 kg)  ?11/06/20 144 lb (65.3 kg)  ? ? ? ?  01/08/2021  ?  9:57 AM 08/29/2020  ?  8:30 AM 03/15/2019  ?  8:42 AM 08/31/2017  ?  7:48 AM 10/21/2015  ?  3:00 PM  ?Depression screen PHQ 2/9  ?Decreased Interest 0 0 0 0 0  ?Down, Depressed, Hopeless 0 0 0 0 0  ?PHQ - 2 Score 0 0 0 0 0  ? ? ? ?   ? View : No data to display.  ?  ?  ?  ? ? ? ? ?  ?Physical Exam ?Constitutional: She appears well-developed and well-nourished. No distress.  ?HENT:  ?Head: Normocephalic and atraumatic.  ?Right Ear: External ear normal. Normal ear canal and TM ?Left Ear: External ear normal.  Normal ear canal and TM ?Mouth/Throat: Oropharynx is clear and moist.  ?Eyes: Conjunctivae and EOM are normal.  ?Neck: Neck supple. No tracheal deviation present. No thyromegaly present.  ?No carotid bruit  ?Cardiovascular: Normal rate, regular rhythm and normal heart sounds.   ?No murmur heard.  No edema. ?Pulmonary/Chest: Effort normal and breath sounds normal. No respiratory distress. She has no wheezes. She has no rales.  ?Breast: deferred   ?Abdominal: Soft. She exhibits no distension. There is no tenderness.  ?Lymphadenopathy: She has no cervical adenopathy.  ?Skin: Skin is warm and dry. She is not diaphoretic.  ?Psychiatric: She has a normal mood and affect. Her behavior is normal.  ? ? ? ?Lab Results  ?Component Value Date  ? WBC 8.0 03/15/2019  ? HGB 13.3 03/15/2019  ? HCT 40.9  03/15/2019  ? PLT 328.0 03/15/2019  ? GLUCOSE 101 (H) 11/06/2020  ? CHOL 196 03/15/2019  ? TRIG 58.0 03/15/2019  ? HDL 70.60 03/15/2019  ? LDLDIRECT 128.2 09/12/2013  ? LDLCALC 114 (H) 03/15/2019  ? ALT 9 11/06/2020  ? AST 19 11/06/2020  ? NA 140 11/06/2020  ? K 3.8 11/06/2020  ? CL 102 11/06/2020  ? CREATININE 0.56 11/06/2020  ? BUN 8 11/06/2020  ? CO2 30 11/06/2020  ? TSH 1.00 11/06/2020  ? HGBA1C 5.9 11/06/2020  ? ? ? ? ?   ?Assessment & Plan:  ? ?Physical exam: ?Screening blood work  ordered ?Exercise  not regular ?Weight  normal ?Substance abuse  none ? ? ?Reviewed recommended immunizations. ? ? ?Health Maintenance  ?Topic Date Due  ? Zoster Vaccines- Shingrix (1 of 2) Never done  ? COVID-19 Vaccine (3 - Booster for Moderna series) 12/19/2019  ? DEXA SCAN  01/01/2022  ? MAMMOGRAM  01/07/2022  ? COLONOSCOPY (Pts 45-42yr Insurance coverage will need to be confirmed)  10/26/2022  ? TETANUS/TDAP  08/29/2030  ? Pneumonia Vaccine 74 Years old  Completed  ? INFLUENZA VACCINE  Completed  ? Hepatitis C Screening  Completed  ? HPV VACCINES  Aged Out  ?  ? ? ? ? ? ? ?See Problem List for Assessment and Plan of chronic medical problems. ? ? ? ? ?

## 2021-11-12 ENCOUNTER — Other Ambulatory Visit: Payer: Self-pay

## 2021-11-12 ENCOUNTER — Ambulatory Visit (INDEPENDENT_AMBULATORY_CARE_PROVIDER_SITE_OTHER): Payer: PPO | Admitting: Internal Medicine

## 2021-11-12 VITALS — BP 144/72 | HR 73 | Temp 98.2°F | Ht 63.0 in | Wt 132.2 lb

## 2021-11-12 DIAGNOSIS — F0670 Mild neurocognitive disorder due to known physiological condition without behavioral disturbance: Secondary | ICD-10-CM | POA: Diagnosis not present

## 2021-11-12 DIAGNOSIS — R7303 Prediabetes: Secondary | ICD-10-CM

## 2021-11-12 DIAGNOSIS — E559 Vitamin D deficiency, unspecified: Secondary | ICD-10-CM | POA: Diagnosis not present

## 2021-11-12 DIAGNOSIS — G309 Alzheimer's disease, unspecified: Secondary | ICD-10-CM

## 2021-11-12 DIAGNOSIS — E538 Deficiency of other specified B group vitamins: Secondary | ICD-10-CM | POA: Diagnosis not present

## 2021-11-12 DIAGNOSIS — M818 Other osteoporosis without current pathological fracture: Secondary | ICD-10-CM | POA: Diagnosis not present

## 2021-11-12 DIAGNOSIS — Z0000 Encounter for general adult medical examination without abnormal findings: Secondary | ICD-10-CM | POA: Diagnosis not present

## 2021-11-12 LAB — LIPID PANEL
Cholesterol: 204 mg/dL — ABNORMAL HIGH (ref 0–200)
HDL: 72.7 mg/dL (ref >39.00)
LDL Cholesterol: 122 mg/dL — ABNORMAL HIGH (ref 0–99)
NonHDL: 131.54
Total CHOL/HDL Ratio: 3
Triglycerides: 50 mg/dL (ref 0.0–149.0)
VLDL: 10 mg/dL (ref 0.0–40.0)

## 2021-11-12 LAB — VITAMIN D 25 HYDROXY (VIT D DEFICIENCY, FRACTURES): VITD: 29.59 ng/mL — ABNORMAL LOW (ref 30.00–100.00)

## 2021-11-12 LAB — HEMOGLOBIN A1C: Hgb A1c MFr Bld: 6 % (ref 4.6–6.5)

## 2021-11-12 LAB — CBC WITH DIFFERENTIAL/PLATELET
Basophils Absolute: 0.1 10*3/uL (ref 0.0–0.1)
Basophils Relative: 1.7 % (ref 0.0–3.0)
Eosinophils Absolute: 0.1 10*3/uL (ref 0.0–0.7)
Eosinophils Relative: 1.9 % (ref 0.0–5.0)
HCT: 39.3 % (ref 36.0–46.0)
Hemoglobin: 13 g/dL (ref 12.0–15.0)
Lymphocytes Relative: 17.4 % (ref 12.0–46.0)
Lymphs Abs: 1.1 10*3/uL (ref 0.7–4.0)
MCHC: 33 g/dL (ref 30.0–36.0)
MCV: 83.1 fl (ref 78.0–100.0)
Monocytes Absolute: 0.5 10*3/uL (ref 0.1–1.0)
Monocytes Relative: 7.2 % (ref 3.0–12.0)
Neutro Abs: 4.5 10*3/uL (ref 1.4–7.7)
Neutrophils Relative %: 71.8 % (ref 43.0–77.0)
Platelets: 289 10*3/uL (ref 150.0–400.0)
RBC: 4.73 Mil/uL (ref 3.87–5.11)
RDW: 15.2 % (ref 11.5–15.5)
WBC: 6.3 10*3/uL (ref 4.0–10.5)

## 2021-11-12 LAB — COMPREHENSIVE METABOLIC PANEL
ALT: 6 U/L (ref 0–35)
AST: 16 U/L (ref 0–37)
Albumin: 4.1 g/dL (ref 3.5–5.2)
Alkaline Phosphatase: 82 U/L (ref 39–117)
BUN: 9 mg/dL (ref 6–23)
CO2: 30 mEq/L (ref 19–32)
Calcium: 9 mg/dL (ref 8.4–10.5)
Chloride: 103 mEq/L (ref 96–112)
Creatinine, Ser: 0.63 mg/dL (ref 0.40–1.20)
GFR: 87.72 mL/min (ref >60.00)
Glucose, Bld: 93 mg/dL (ref 70–99)
Potassium: 3.6 mEq/L (ref 3.5–5.1)
Sodium: 140 mEq/L (ref 135–145)
Total Bilirubin: 0.6 mg/dL (ref 0.2–1.2)
Total Protein: 6.6 g/dL (ref 6.0–8.3)

## 2021-11-12 LAB — TSH: TSH: 1.3 u[IU]/mL (ref 0.35–5.50)

## 2021-11-12 LAB — VITAMIN B12: Vitamin B-12: >1504 pg/mL — ABNORMAL HIGH (ref 211–911)

## 2021-11-12 MED ORDER — ALENDRONATE SODIUM 70 MG PO TABS: 70.0000 mg | ORAL_TABLET | ORAL | 3 refills | Status: DC

## 2021-11-12 NOTE — Assessment & Plan Note (Signed)
Chronic ?Has not been taking fosamax - recommended she restart it -- she agrees ?Start fosamax 70 mg weekly ?Hold off on dexa until next year ?She is active ?Continue MVI ?Check vit d level ?

## 2021-11-12 NOTE — Assessment & Plan Note (Signed)
Chronic Check a1c Low sugar / carb diet Stressed regular exercise  

## 2021-11-12 NOTE — Assessment & Plan Note (Signed)
Chronic °Taking multivitamin daily °Check vitamin D level °

## 2021-11-12 NOTE — Assessment & Plan Note (Addendum)
Chronic ?Following with neurology ?Currently on Namenda 5 mg twice daily ?Husband feels her memory is stable ?

## 2021-11-12 NOTE — Assessment & Plan Note (Signed)
Chronic ?Getting B12 injections monthly ?Last injection 11/05/2021 ?We will check B12 level ?Continue B12 injections monthly-discussed can try oral supplementation ?

## 2021-12-03 ENCOUNTER — Other Ambulatory Visit: Payer: Self-pay | Admitting: Physician Assistant

## 2021-12-07 ENCOUNTER — Other Ambulatory Visit: Payer: Self-pay | Admitting: Physician Assistant

## 2021-12-10 ENCOUNTER — Ambulatory Visit: Payer: PPO

## 2021-12-10 ENCOUNTER — Other Ambulatory Visit: Payer: Self-pay | Admitting: Physician Assistant

## 2021-12-16 ENCOUNTER — Ambulatory Visit (INDEPENDENT_AMBULATORY_CARE_PROVIDER_SITE_OTHER): Payer: PPO

## 2021-12-16 DIAGNOSIS — E538 Deficiency of other specified B group vitamins: Secondary | ICD-10-CM

## 2021-12-16 MED ORDER — CYANOCOBALAMIN 1000 MCG/ML IJ SOLN
1000.0000 ug | Freq: Once | INTRAMUSCULAR | Status: AC
  Administered 2021-12-16: 1000 ug via INTRAMUSCULAR

## 2021-12-16 NOTE — Progress Notes (Signed)
After obtaining consent, and per orders of Dr. Quay Burow, injection of vitamin B12 given by Max Sane. Patient tolerated injection well in the right deltoid and was informed to report any adverse reaction to me immediately.  ?

## 2022-01-14 DIAGNOSIS — Z1231 Encounter for screening mammogram for malignant neoplasm of breast: Secondary | ICD-10-CM | POA: Diagnosis not present

## 2022-01-14 LAB — HM MAMMOGRAPHY

## 2022-01-15 ENCOUNTER — Ambulatory Visit: Payer: PPO

## 2022-01-20 ENCOUNTER — Ambulatory Visit (INDEPENDENT_AMBULATORY_CARE_PROVIDER_SITE_OTHER): Payer: PPO

## 2022-01-20 DIAGNOSIS — E538 Deficiency of other specified B group vitamins: Secondary | ICD-10-CM | POA: Diagnosis not present

## 2022-01-20 MED ORDER — CYANOCOBALAMIN 1000 MCG/ML IJ SOLN
1000.0000 ug | Freq: Once | INTRAMUSCULAR | Status: AC
  Administered 2022-01-20: 1000 ug via INTRAMUSCULAR

## 2022-01-20 NOTE — Progress Notes (Signed)
B12 administered and tolerated well

## 2022-01-29 ENCOUNTER — Ambulatory Visit: Payer: PPO

## 2022-02-01 ENCOUNTER — Ambulatory Visit (INDEPENDENT_AMBULATORY_CARE_PROVIDER_SITE_OTHER): Payer: PPO

## 2022-02-01 DIAGNOSIS — Z0000 Encounter for general adult medical examination without abnormal findings: Secondary | ICD-10-CM | POA: Diagnosis not present

## 2022-02-01 NOTE — Progress Notes (Signed)
Subjective:   Madison Wright is a 76 y.o. female who presents for Medicare Annual (Subsequent) preventive examination.  I connected with Ragena Fiola  today by telephone and verified that I am speaking with the correct person using two identifiers. Location patient: home Location provider: work Persons participating in the virtual visit: patient, provider.   I discussed the limitations, risks, security and privacy concerns of performing an evaluation and management service by telephone and the availability of in person appointments. I also discussed with the patient that there may be a patient responsible charge related to this service. The patient expressed understanding and verbally consented to this telephonic visit.    Interactive audio and video telecommunications were attempted between this provider and patient, however failed, due to patient having technical difficulties OR patient did not have access to video capability.  We continued and completed visit with audio only.    Review of Systems     Cardiac Risk Factors include: advanced age (>87mn, >>55women)     Objective:    Today's Vitals   There is no height or weight on file to calculate BMI.     02/01/2022    8:58 AM 01/08/2021    9:58 AM 04/30/2015    9:09 AM  Advanced Directives  Does Patient Have a Medical Advance Directive? Yes Yes Yes  Type of [place] Living will;Healthcare Power of Attorney   Does patient want to make changes to medical advance directive?  No - Patient declined   Copy of [place] Chart? No - copy requested No - copy requested Yes    Current Medications (verified) Outpatient Encounter Medications as of 02/01/2022  Medication Sig   alendronate (FOSAMAX) 70 MG tablet Take 1 tablet (70 mg total) by mouth every 7 (seven) days. Take with a full glass of water on an empty stomach.   memantine (NAMENDA) 5 MG tablet TAKE 1 TABLET  BY MOUTH AT NIGHT X 2 WEEKS. THEN 1 TABLET TWICE A DAY   Multiple Vitamin (MULTIVITAMIN) tablet Take 1 tablet by mouth daily.   Probiotic Product (ALIGN PO) Take by mouth.   No facility-administered encounter medications on file as of 02/01/2022.    Allergies (verified) Sulfonamide derivatives and Codeine   History: Past Medical History:  Diagnosis Date   Diverticulosis of colon    Diverticulosis of large intestine 01/07/2010   Granuloma annulare 2008   Menopausal state 11/2000   Took HRT X 10 days only  in 05/2001 [facility]   Mild neurocognitive disorder due to Alzheimer's disease, possible 02/09/2021   Osteoporosis    Post-menopausal atrophic vaginitis 07/2005   Prediabetes 11/20/2015   Vitamin B12 deficiency 11/06/2020   Vitamin D deficiency    Past Surgical History:  Procedure Laterality Date   COLONOSCOPY  2004  & 2014   diverticulosis, Dr BOlevia Perches  SKIN CANCER EXCISION  2003   Basal skin   WRIST FRACTURE SURGERY  2012   [facility]   Family History  Problem Relation Age of Onset   Arthritis Mother    Osteoporosis Mother        died after fall with fx. hips and PE   Stroke Mother        post immobilization with vertebral fracture   Migraines Mother    COPD Father    Colon cancer Paternal Aunt 419  Heart disease Paternal Aunt  X 2; both had MI > 65   Diabetes Paternal Aunt    Heart failure Paternal Grandmother    Cancer - Colon Unknown        nephew - chemo   Social History   Socioeconomic History   Marital status: Married    Spouse name: Amel Kitch   Number of children: Not on file   Years of education: 12   Highest education level: High school graduate  Occupational History   Occupation: Research scientist (physical sciences): [employer]  Tobacco Use   Smoking status: Never   Smokeless tobacco: Never  Vaping Use   Vaping Use: Never used  Substance and Sexual Activity   Alcohol use: No    Alcohol/week: 0.0 standard drinks of  alcohol   Drug use: No   Sexual activity: Not Currently    Partners: Male    Birth control/protection: Post-menopausal  Other Topics Concern   Not on file  Social History Narrative   exercises   Social Determinants of Health   Financial Resource Strain: Low Risk  (02/01/2022)   Overall Financial Resource Strain (CARDIA)    Difficulty of Paying Living Expenses: Not hard at all  Food Insecurity: No Food Insecurity (02/01/2022)   Hunger Vital Sign    Worried About Running Out of Food in the Last Year: Never true    [place] in the Last Year: Never true  Transportation Needs: No Transportation Needs (02/01/2022)   PRAPARE - Hydrologist (Medical): No    Lack of Transportation (Non-Medical): No  Physical Activity: Insufficiently Active (02/01/2022)   Exercise Vital Sign    Days of Exercise per Week: 2 days    Minutes of Exercise per Session: 60 min  Stress: No Stress Concern Present (02/01/2022)   [place]    Feeling of Stress : Not at all  Social Connections: [place] (02/01/2022)   Social Connection and Isolation Panel [NHANES]    Frequency of Communication with Friends and Family: Three times a week    Frequency of Social Gatherings with Friends and Family: Three times a week    Attends Religious Services: More than 4 times per year    Active Member of Clubs or Organizations: Yes    Attends Archivist Meetings: 1 to 4 times per year    Marital Status: Married    Tobacco Counseling Counseling given: Not Answered   Clinical Intake:  Pre-visit preparation completed: Yes  Pain : No/denies pain     Nutritional Risks: None Diabetes: No  How often do you need to have someone help you when you read instructions, pamphlets, or other written materials from your doctor or pharmacy?: 1 - Never What is the last grade level you completed in school?: High  School  Diabetic?no   Interpreter Needed?: No  Information entered by :: [place] of Daily Living    02/01/2022    8:59 AM  In your present state of health, do you have any difficulty performing the following activities:  Hearing? 0  Vision? 0  Difficulty concentrating or making decisions? 0  Walking or climbing stairs? 0  Dressing or bathing? 0  Doing errands, shopping? 0  Preparing Food and eating ? N  Using the Toilet? N  In the past six months, have you accidently leaked urine? N  Do you have problems with loss of bowel control? N  Managing  your Medications? N  Managing your Finances? N    Patient Care Team: Binnie Rail, MD as PCP - General (Internal Medicine) Dingeldein, Remo Lipps, MD as Consulting Physician (Ophthalmology)  Indicate any recent Medical Services you may have received from other than [facility] providers in the past year (date may be approximate).     Assessment:   This is a routine wellness examination for Yoshiko.  Hearing/Vision screen Vision Screening - Comments:: Annual eye exams wear glasses   Dietary issues and exercise activities discussed: Current Exercise Habits: Home exercise routine, Type of exercise: walking, Time (Minutes): 45, Frequency (Times/Week): 2, Weekly Exercise (Minutes/Week): 90, Intensity: Mild, Exercise limited by: None identified   Goals Addressed   None    Depression Screen    02/01/2022    8:59 AM 02/01/2022    8:57 AM 01/08/2021    9:57 AM 08/29/2020    8:30 AM 03/15/2019    8:42 AM 08/31/2017    7:48 AM 10/21/2015    3:00 PM  PHQ 2/9 Scores  PHQ - 2 Score 0 0 0 0 0 0 0    Fall Risk    02/01/2022    8:59 AM 01/08/2021    9:58 AM 01/23/2020    8:08 AM 07/18/2019    5:43 PM 03/15/2019    8:41 AM  Fall Risk   Falls in the past year? 0 0 0 0 0  Comment    Emmi Telephone Survey: data to providers prior to load   Number falls in past yr: 0 0 0  0  Injury with Fall? 0 0 0    Risk for fall due to : No Fall  Risks No Fall Risks     Follow up Falls evaluation completed Falls evaluation completed Falls evaluation completed      [place]:  Any stairs in or around the home? No  If so, are there any without handrails? No  Home free of loose throw rugs in walkways, pet beds, electrical cords, etc? Yes  Adequate lighting in your home to reduce risk of falls? Yes   ASSISTIVE DEVICES UTILIZED TO PREVENT FALLS:  Life alert? No  Use of a cane, walker or w/c? No  Grab bars in the bathroom? Yes  Shower chair or bench in shower? No  Elevated toilet seat or a handicapped toilet? No     Cognitive Function:  Normal cognitive status assessed by telephone conversation  by this Nurse Health Advisor. No abnormalities found.        Immunizations Immunization History  Administered Date(s) Administered   Fluad Quad(high Dose 65+) 05/18/2019, 06/18/2021   Influenza Split 08/08/2012   Influenza, High Dose Seasonal PF 06/13/2013, 04/30/2015, 06/02/2016, 06/11/2017, 06/16/2018   Influenza,inj,Quad PF,6+ Mos 06/13/2014   Moderna Sars-Covid-2 Vaccination 09/28/2019, 10/24/2019   Pneumococcal Conjugate-13 04/30/2015   Pneumococcal Polysaccharide-23 09/12/2013   Td 03/06/2008   Tdap 08/29/2020   Zoster, Live 03/03/2012    TDAP status: Up to date  Flu Vaccine status: Up to date  Pneumococcal vaccine status: Up to date  Covid-19 vaccine status: Completed vaccines  Qualifies for Shingles Vaccine? Yes   Zostavax completed No   Shingrix Completed?: No.    Education has been provided regarding the importance of this vaccine. Patient has been advised to call insurance company to determine out of pocket expense if they have not yet received this vaccine. Advised may also receive vaccine at local pharmacy or Health Dept. Verbalized acceptance and understanding.  Screening Tests Health Maintenance  Topic Date Due   COVID-19 Vaccine (3 - Moderna series) 12/19/2019   DEXA  SCAN  01/01/2022   MAMMOGRAM  01/07/2022   Zoster Vaccines- Shingrix (1 of 2) 02/12/2022 (Originally 12/26/1997)   INFLUENZA VACCINE  03/23/2022   COLONOSCOPY (Pts 45-67yr Insurance coverage will need to be confirmed)  10/26/2022   TETANUS/TDAP  08/29/2030   Pneumonia Vaccine 76 Years old  Completed   Hepatitis C Screening  Completed   HPV VACCINES  Aged Out    Health Maintenance  Health Maintenance Due  Topic Date Due   COVID-19 Vaccine (3 - Moderna series) 12/19/2019   DEXA SCAN  01/01/2022   MAMMOGRAM  01/07/2022    Colorectal cancer screening: Type of screening: Colonoscopy. Completed 10/25/2012. Repeat every 10 years  Mammogram status: Completed 01/07/2021. Repeat every year  Bone Density status: Completed 01/02/2020. Results reflect: Bone density results: OSTEOPENIA. Repeat every 5 years.  Lung Cancer Screening: (Low Dose CT Chest recommended if Age 941-80years, 30 pack-year currently smoking OR have quit w/in 15years.) does not qualify.   Lung Cancer Screening Referral: n/a  Additional Screening:  Hepatitis C Screening: does not qualify;   Vision Screening: Recommended annual ophthalmology exams for early detection of glaucoma and other disorders of the eye. Is the patient up to date with their annual eye exam?  Yes  Who is the provider or what is the name of the office in which the patient attends annual eye exams? [facility] If pt is not established with a provider, would they like to be referred to a provider to establish care? No .   Dental Screening: Recommended annual dental exams for proper oral hygiene  Community Resource Referral / Chronic Care Management: CRR required this visit?  No   CCM required this visit?  No      Plan:     I have personally reviewed and noted the following in the patient's chart:   Medical and social history Use of alcohol, tobacco or illicit drugs  Current medications and supplements including opioid  prescriptions.  Functional ability and status Nutritional status Physical activity Advanced directives List of other physicians Hospitalizations, surgeries, and ER visits in previous 12 months Vitals Screenings to include cognitive, depression, and falls Referrals and appointments  In addition, I have reviewed and discussed with patient certain preventive protocols, quality metrics, and best practice recommendations. A written personalized care plan for preventive services as well as general preventive health recommendations were provided to patient.     LRandel Pigg LPN   [id number]  Nurse Notes: none

## 2022-02-01 NOTE — Patient Instructions (Signed)
Madison Wright , Thank you for taking time to come for your Medicare Wellness Visit. I appreciate your ongoing commitment to your health goals. Please review the following plan we discussed and let me know if I can assist you in the future.   Screening recommendations/referrals: Colonoscopy: 10/25/2012 Mammogram: 01/07/2021 Bone Density: 01/02/2020 Recommended yearly ophthalmology/optometry visit for glaucoma screening and checkup Recommended yearly dental visit for hygiene and checkup  Vaccinations: Influenza vaccine: completed  Pneumococcal vaccine: completed  Tdap vaccine: 08/29/2020 Shingles vaccine: will consider   Advanced directives: yes   Conditions/risks identified: none   Next appointment: none    Preventive Care 36 Years and Older, Female Preventive care refers to lifestyle choices and visits with your health care provider that can promote health and wellness. What does preventive care include? A yearly physical exam. This is also called an annual well check. Dental exams once or twice a year. Routine eye exams. Ask your health care provider how often you should have your eyes checked. Personal lifestyle choices, including: Daily care of your teeth and gums. Regular physical activity. Eating a healthy diet. Avoiding tobacco and drug use. Limiting alcohol use. Practicing safe sex. Taking low-dose aspirin every day. Taking vitamin and mineral supplements as recommended by your health care provider. What happens during an annual well check? The services and screenings done by your health care provider during your annual well check will depend on your age, overall health, lifestyle risk factors, and family history of disease. Counseling  Your health care provider may ask you questions about your: Alcohol use. Tobacco use. Drug use. Emotional well-being. Home and relationship well-being. Sexual activity. Eating habits. History of falls. Memory and ability to  understand (cognition). Work and work Statistician. Reproductive health. Screening  You may have the following tests or measurements: Height, weight, and BMI. Blood pressure. Lipid and cholesterol levels. These may be checked every 5 years, or more frequently if you are over 53 years old. Skin check. Lung cancer screening. You may have this screening every year starting at age 60 if you have a 30-pack-year history of smoking and currently smoke or have quit within the past 15 years. Fecal occult blood test (FOBT) of the stool. You may have this test every year starting at age 33. Flexible sigmoidoscopy or colonoscopy. You may have a sigmoidoscopy every 5 years or a colonoscopy every 10 years starting at age 1. Hepatitis C blood test. Hepatitis B blood test. Sexually transmitted disease (STD) testing. Diabetes screening. This is done by checking your blood sugar (glucose) after you have not eaten for a while (fasting). You may have this done every 1-3 years. Bone density scan. This is done to screen for osteoporosis. You may have this done starting at age 44. Mammogram. This may be done every 1-2 years. Talk to your health care provider about how often you should have regular mammograms. Talk with your health care provider about your test results, treatment options, and if necessary, the need for more tests. Vaccines  Your health care provider may recommend certain vaccines, such as: Influenza vaccine. This is recommended every year. Tetanus, diphtheria, and acellular pertussis (Tdap, Td) vaccine. You may need a Td booster every 10 years. Zoster vaccine. You may need this after age 72. Pneumococcal 13-valent conjugate (PCV13) vaccine. One dose is recommended after age 67. Pneumococcal polysaccharide (PPSV23) vaccine. One dose is recommended after age 73. Talk to your health care provider about which screenings and vaccines you need and how often you need  them. This information is not  intended to replace advice given to you by your health care provider. Make sure you discuss any questions you have with your health care provider. Document Released: 09/05/2015 Document Revised: 04/28/2016 Document Reviewed: 06/10/2015 Elsevier Interactive Patient Education  2017 [employer] Prevention in the Home Falls can cause injuries. They can happen to people of all ages. There are many things you can do to make your home safe and to help prevent falls. What can I do on the outside of my home? Regularly fix the edges of walkways and driveways and fix any cracks. Remove anything that might make you trip as you walk through a door, such as a raised step or threshold. Trim any bushes or trees on the path to your home. Use bright outdoor lighting. Clear any walking paths of anything that might make someone trip, such as rocks or tools. Regularly check to see if handrails are loose or broken. Make sure that both sides of any steps have handrails. Any raised decks and porches should have guardrails on the edges. Have any leaves, snow, or ice cleared regularly. Use sand or salt on walking paths during winter. Clean up any spills in your garage right away. This includes oil or grease spills. What can I do in the bathroom? Use night lights. Install grab bars by the toilet and in the tub and shower. Do not use towel bars as grab bars. Use non-skid mats or decals in the tub or shower. If you need to sit down in the shower, use a plastic, non-slip stool. Keep the floor dry. Clean up any water that spills on the floor as soon as it happens. Remove soap buildup in the tub or shower regularly. Attach bath mats securely with double-sided non-slip rug tape. Do not have throw rugs and other things on the floor that can make you trip. What can I do in the bedroom? Use night lights. Make sure that you have a light by your bed that is easy to reach. Do not use any sheets or blankets that are  too big for your bed. They should not hang down onto the floor. Have a firm chair that has side arms. You can use this for support while you get dressed. Do not have throw rugs and other things on the floor that can make you trip. What can I do in the kitchen? Clean up any spills right away. Avoid walking on wet floors. Keep items that you use a lot in easy-to-reach places. If you need to reach something above you, use a strong step stool that has a grab bar. Keep electrical cords out of the way. Do not use floor polish or wax that makes floors slippery. If you must use wax, use non-skid floor wax. Do not have throw rugs and other things on the floor that can make you trip. What can I do with my stairs? Do not leave any items on the stairs. Make sure that there are handrails on both sides of the stairs and use them. Fix handrails that are broken or loose. Make sure that handrails are as long as the stairways. Check any carpeting to make sure that it is firmly attached to the stairs. Fix any carpet that is loose or worn. Avoid having throw rugs at the top or bottom of the stairs. If you do have throw rugs, attach them to the floor with carpet tape. Make sure that you have a light switch at  the top of the stairs and the bottom of the stairs. If you do not have them, ask someone to add them for you. What else can I do to help prevent falls? Wear shoes that: Do not have high heels. Have rubber bottoms. Are comfortable and fit you well. Are closed at the toe. Do not wear sandals. If you use a stepladder: Make sure that it is fully opened. Do not climb a closed stepladder. Make sure that both sides of the stepladder are locked into place. Ask someone to hold it for you, if possible. Clearly mark and make sure that you can see: Any grab bars or handrails. First and last steps. Where the edge of each step is. Use tools that help you move around (mobility aids) if they are needed. These  include: Canes. Walkers. Scooters. Crutches. Turn on the lights when you go into a dark area. Replace any light bulbs as soon as they burn out. Set up your furniture so you have a clear path. Avoid moving your furniture around. If any of your floors are uneven, fix them. If there are any pets around you, be aware of where they are. Review your medicines with your doctor. Some medicines can make you feel dizzy. This can increase your chance of falling. Ask your doctor what other things that you can do to help prevent falls. This information is not intended to replace advice given to you by your health care provider. Make sure you discuss any questions you have with your health care provider. Document Released: 06/05/2009 Document Revised: 01/15/2016 Document Reviewed: 09/13/2014 Elsevier Interactive Patient Education  2017 [employer].

## 2022-02-24 ENCOUNTER — Ambulatory Visit: Payer: PPO

## 2022-03-01 ENCOUNTER — Other Ambulatory Visit: Payer: Self-pay | Admitting: Physician Assistant

## 2022-03-01 ENCOUNTER — Encounter: Payer: Self-pay | Admitting: Internal Medicine

## 2022-03-01 NOTE — Progress Notes (Signed)
Outside notes received. Information abstracted. Notes sent to scan.  

## 2022-03-02 ENCOUNTER — Telehealth: Payer: Self-pay | Admitting: Physician Assistant

## 2022-03-02 ENCOUNTER — Other Ambulatory Visit: Payer: Self-pay

## 2022-03-02 MED ORDER — MEMANTINE HCL 5 MG PO TABS: ORAL_TABLET | ORAL | 0 refills | Status: DC

## 2022-03-02 NOTE — Telephone Encounter (Signed)
Patients sister called and stated Madison Wright needs a refill on memantine.  She uses [facility] in [place].  She also wanted to let us know that Ruths husband passed yesterday.  She will give [place] a call back to schedule an appointment.

## 2022-03-02 NOTE — Telephone Encounter (Signed)
Pt's sister called in after getting a missed call from [place]. She went ahead and scheduled an appt on 03/12/22. She is needing prescription refilled.

## 2022-03-03 MED ORDER — MEMANTINE HCL 5 MG PO TABS: ORAL_TABLET | ORAL | 0 refills | Status: DC

## 2022-03-03 NOTE — Telephone Encounter (Signed)
Called patients sister and informed her that medication was sent. Patients sister verbalized understanding and had no further questions or concerns.

## 2022-03-11 ENCOUNTER — Encounter: Payer: Self-pay | Admitting: Physician Assistant

## 2022-03-12 ENCOUNTER — Ambulatory Visit: Payer: PPO | Admitting: Physician Assistant

## 2022-03-12 ENCOUNTER — Encounter: Payer: Self-pay | Admitting: Physician Assistant

## 2022-03-12 ENCOUNTER — Ambulatory Visit (INDEPENDENT_AMBULATORY_CARE_PROVIDER_SITE_OTHER): Payer: PPO

## 2022-03-12 VITALS — BP 158/78 | HR 70 | Resp 18 | Ht 65.0 in | Wt 132.0 lb

## 2022-03-12 DIAGNOSIS — F0670 Mild neurocognitive disorder due to known physiological condition without behavioral disturbance: Secondary | ICD-10-CM

## 2022-03-12 DIAGNOSIS — E538 Deficiency of other specified B group vitamins: Secondary | ICD-10-CM | POA: Diagnosis not present

## 2022-03-12 DIAGNOSIS — G309 Alzheimer's disease, unspecified: Secondary | ICD-10-CM | POA: Diagnosis not present

## 2022-03-12 MED ORDER — MEMANTINE HCL 10 MG PO TABS: ORAL_TABLET | ORAL | 11 refills | Status: DC

## 2022-03-12 MED ORDER — CYANOCOBALAMIN 1000 MCG/ML IJ SOLN
1000.0000 ug | Freq: Once | INTRAMUSCULAR | Status: AC
  Administered 2022-03-12: 1000 ug via INTRAMUSCULAR

## 2022-03-12 NOTE — Progress Notes (Signed)
B12 given Please cosign 

## 2022-03-12 NOTE — Progress Notes (Signed)
Assessment/Plan:   Mild cognitive impairment likely due to Alzheimer's disease  Madison Wright is a very pleasant 76 y.o. RH female Vit D deficiency, Vit 12 deficiency, prediabetes seen today in follow up for memory loss.  Neuropsychological evaluation on 02/09/2021 yielded a diagnosis of mild neurocognitive disorder due to Alzheimer's disease.  MRI of the brain in July 2022 remarkable for moderate atrophy with possible mild temporal lobe predominance, and mild chronic microvascular ischemic disease, with suspected small remote lacunar infarct in the left basal ganglia.  Patient is currently on memantine 5 mg twice daily, tolerating well.  She recently has lost her husband 1 week ago, and she is quite distraught today.  However, she was able to perform a new MoCA yielding a value of 18/30 with delayed recall 2/5, and significant visuospatial executive deficiency at 1/5.   Recommendations:    Increase memantine 10 mg . Side effects were discussed Continue to follow mood as per PCP, recommend psychotherapy and grief counseling Continue B12 and injections as per PCP Recommend good control of the cardiovascular risk factors. Repeat neurocognitive testing and follow-up thereafter   Case discussed with Dr. Delice Lesch who agrees with the plan     Subjective:    This patient is accompanied in the office by her friend who supplements the history.  Previous records as well as any outside records available were reviewed prior to todays visit. Patient was last seen at our office on 02/26/2021.  She lost to follow-up, as she was caring for her dying husband.  She is on memantine 5 mg twice daily, tolerating well.   Any changes in memory since last visit?  Family reports that her memory is worse.  Her husband just died 1 week ago, affecting her memory "even more ".  She forgets recent conversations, and she does notice changes in her short-term memory.  Her long-term memory is good.   Since her husband  died, people have been staying with her, her friend is in the process of arranging someone to stay with her at all times. Repeats oneself?  Endorsed Disoriented when walking into a room?  Patient denies, but attention has decreased "I am more distracted " Leaving objects in unusual places?  She continues to lose objects, forgetting where she placed on, but never on unusual places. Ambulates  with difficulty?   Patient denies   Recent falls?  Patient denies   Any head injuries?  Patient denies   History of seizures?   Patient denies   Wandering behavior?  Patient denies   Patient drives?  She drives short distances and uses a GPS.   Any mood changes such irritability agitation?  Patient denies   Any depression?: Endorsed.  Her spouse Madison Wright passed away on 2022-03-02 and she is very tearful. Hallucinations?  Patient denies   Paranoia?  Patient denies   Patient reports that he sleeps well without vivid dreams, REM behavior or sleepwalking   History of sleep apnea?  Patient denies   Any hygiene concerns?  Patient denies   Independent of bathing and dressing?  Endorsed  Does the patient needs help with medications? "Whoever cares forher will manage because she is distracted and forget" Who is in charge of the finances? Madison Wright is in charge [facility]) Any changes in appetite?  Patient denies   Patient have trouble swallowing? Patient denies   Does the patient cook?  Patient denies   Any kitchen accidents such as leaving the stove on? Patient denies  Any headaches?  Patient denies   Double vision? Patient denies   Any focal numbness or tingling?  Patient denies   Chronic back pain Patient denies   Unilateral weakness?  Patient denies   Any tremors?  Patient denies   Any history of anosmia?  Patient denies   Any incontinence of urine?  Patient denies   Any bowel dysfunction?   Intermittent diarrhea   Initial evaluation on 02/26/2021 patient is seen in neurologic consultation at the request of  [name], PhD for the evaluation of memory.  The patient is accompanied by husband Madison Wright and her sister who supplement the history.   The patient is a 76 y.o. year old female who has had memory issues for about 4 years, at which time she began to notice difficulty keeping the date, and appointments.  Over the last 6 months, these symptoms have become worse, losing objects, or forgetting where she has left them.  Denies leaving these objects in unusual places.  The patient works at [employer], and her husband reports that at times she cannot remember if she has to go to work that day.  Because of these changes, the patient occasionally becomes frustrated, and cries.  No frank depression or irritability is reported.  She sleeps well, and denies vivid dreams or sleepwalking.  She is independent of bathing and dressing.  She takes her own medications and places them in a pillbox.  Her husband is taking a more active role in the finances, as she was missing some payments.  She lost her checkbook, could not find it and had listed in a drawer all this time.  Her appetite has improved over the last 6 months after recovering from the loss of her dog.  She denies trouble swallowing, although she always had trouble swallowing pills.  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  She denies leaving the stove or the faucet on.  She continues to drive with GPS, and denies getting lost.  She ambulates without difficulty without the use of a walker or a cane.  She admits to not having enough exercise.  She denies headaches, trauma or injuries to the head, double vision, dizziness, focal numbness or tingling, unilateral weakness or hemorrhage.  Denies urine incontinence or retention.  She does have a history of intermittent diarrhea.  She denies constipation.  She denies anosmia.  Denies history of OSA, alcohol or tobacco.  Denies family history of dementia.        Neuropsych Evaluation, Dr.  Melvyn Novas 02/09/21 "...Results suggested prominent impairments surrounding all aspects of learning and memory. Additional impairments were exhibited across complex attention, phonemic fluency, and confrontation naming, while variability was exhibited across cognitive flexibility and visuospatial abilities. Regarding etiology, the most likely culprit for memory impairment and other cognitive dysfunction at the present time is Alzheimer's disease. Ms. Luz was largely amnestic across assessed memory tasks and performed very poorly across yes/no recognition items. This suggests the presence of a memory storage deficit, which is the hallmark characteristic of this illness. Additional impairments across confrontation naming and variability across visuospatial functioning and cognitive flexibility would be further consistent with this presentation.Marland KitchenMarland KitchenA repeat neuropsychological evaluation in 18 months (or sooner if functional decline is noted) is recommended to assess the trajectory of future cognitive decline should it occur. This will also aid in future efforts towards improved diagnostic clarity."   Labs from 11/06/20 Vit B12 134 TSH   1.00 CBC and CMP unremarkable LDL  114 (mildly elevated) o/w nl  PREVIOUS MEDICATIONS:   CURRENT MEDICATIONS:  Outpatient Encounter Medications as of 03/12/2022  Medication Sig   alendronate (FOSAMAX) 70 MG tablet Take 1 tablet (70 mg total) by mouth every 7 (seven) days. Take with a full glass of water on an empty stomach.   Multiple Vitamin (MULTIVITAMIN) tablet Take 1 tablet by mouth daily.   Probiotic Product (ALIGN PO) Take by mouth.   [DISCONTINUED] memantine (NAMENDA) 5 MG tablet Take one tab two times a day   memantine (NAMENDA) 10 MG tablet Take one tab two times a day   No facility-administered encounter medications on file as of 03/12/2022.        No data to display            03/12/2022    2:00 PM  Montreal Cognitive Assessment   Visuospatial/ Executive  (0/5) 1  Naming (0/3) 3  Attention: Read list of digits (0/2) 2  Attention: Read list of letters (0/1) 1  Attention: Serial 7 subtraction starting at 100 (0/3) 2  Language: Repeat phrase (0/2) 2  Language : Fluency (0/1) 0  Abstraction (0/2) 1  Delayed Recall (0/5) 2  Orientation (0/6) 4  Total 18  Adjusted Score (based on education) 18    Objective:     PHYSICAL EXAMINATION:    VITALS:   Vitals:   03/12/22 0911  BP: (!) 158/78  Pulse: 70  Resp: 18  SpO2: 98%  Weight: 132 lb (59.9 kg)  Height: '5\' 5"'$  (1.651 m)    GEN:  The patient appears stated age and is in NAD. HEENT:  Normocephalic, atraumatic.   Neurological examination:  General: NAD, well-groomed, appears stated age. Orientation: The patient is alert. Oriented to person, place and date Cranial nerves: There is good facial symmetry.The speech is fluent and clear. No aphasia or dysarthria. Fund of knowledge is appropriate. Recent and remote memory are impaired. Attention and concentration are reduced.  Able to name objects and repeat phrases.  Hearing is intact to conversational tone.    Sensation: Sensation is intact to light touch throughout Motor: Strength is at least antigravity x4. Tremors: none  DTR's 2/4 in UE/LE     Movement examination: Tone: There is normal tone in the UE/LE Abnormal movements:  no tremor.  No myoclonus.  No asterixis.   Coordination:  There is no decremation with RAM's. Normal finger to nose  Gait and Station: The patient has no difficulty arising out of a deep-seated chair without the use of the hands. The patient's stride length is good.  Gait is cautious and narrow.    Thank you for allowing [place] the opportunity to participate in the care of this nice patient. Please do not hesitate to contact us for any questions or concerns.   Total time spent on today's visit was 40 minutes dedicated to this patient today, preparing to see patient, examining the patient, ordering tests and/or  medications and counseling the patient, documenting clinical information in the EHR or other health record, independently interpreting results and communicating results to the patient/family, discussing treatment and goals, answering patient's questions and coordinating care.  Cc:  Binnie Rail, MD  Sharene Butters 03/12/2022 2:50 PM

## 2022-03-12 NOTE — Patient Instructions (Signed)
It was a pleasure to see you today at our office.   Recommendations:  Increase memantine 10 mg . Side effects were discussed Continue to follow mood as per PCP, recommend psychotherapy  Continue B12 and injections as per PCP Recommend good control of the cardiovascular risk factors. Follow up after neurocognitive testing  Repeat neurocognitive testing   Whom to call:  Memory  decline, memory medications: Call our office [phone]   For psychiatric meds, mood meds: Please have your primary care physician manage these medications.   Counseling regarding caregiver distress, including caregiver depression, anxiety and issues regarding community resources, adult day care programs, adult living facilities, or memory care questions:   Feel free to contact [place], Social Worker at [phone]   For assessment of decision of mental capacity and competency:  Call Dr. Anthoney Harada, geriatric psychiatrist at [phone]  For guidance in geriatric dementia issues please call [facility] [phone]    If you have any severe symptoms of a stroke, or other severe issues such as confusion,severe chills or fever, etc call 911 or go to the ER as you may need to be evaluated further   Feel free to visit Facebook page " [email or web address]" for tips of how to care for people with memory problems.      RECOMMENDATIONS FOR ALL PATIENTS WITH MEMORY PROBLEMS: 1. Continue to exercise (Recommend 30 minutes of walking everyday, or 3 hours every week) 2. Increase social interactions - continue going to [place] and enjoy social gatherings with friends and family 3. Eat healthy, avoid fried foods and eat more fruits and vegetables 4. Maintain adequate blood pressure, blood sugar, and blood cholesterol level. Reducing the risk of stroke and cardiovascular disease also helps promoting better memory. 5. Avoid stressful situations. Live a simple life and avoid aggravations. Organize your time  and prepare for the next day in anticipation. 6. Sleep well, avoid any interruptions of sleep and avoid any distractions in the bedroom that may interfere with adequate sleep quality 7. Avoid sugar, avoid sweets as there is a strong link between excessive sugar intake, diabetes, and cognitive impairment We discussed the Mediterranean diet, which has been shown to help patients reduce the risk of progressive memory disorders and reduces cardiovascular risk. This includes eating fish, eat fruits and green leafy vegetables, nuts like almonds and hazelnuts, walnuts, and also use olive oil. Avoid fast foods and fried foods as much as possible. Avoid sweets and sugar as sugar use has been linked to worsening of memory function.  There is always a concern of gradual progression of memory problems. If this is the case, then we may need to adjust level of care according to patient needs. Support, both to the patient and caregiver, should then be put into place.    The Alzheimer's Association is here all day, every day for people facing Alzheimer's disease through our free 24/7 Helpline: [phone]. The Helpline provides reliable information and support to all those who need assistance, such as individuals living with memory loss, Alzheimer's or other dementia, caregivers, health care professionals and the public.  Our highly trained and knowledgeable staff can help you with: Understanding memory loss, dementia and Alzheimer's  Medications and other treatment options  General information about aging and brain health  Skills to provide quality care and to find the best care from professionals  Legal, financial and living-arrangement decisions Our Helpline also features: Confidential care consultation provided by master's level clinicians who can help with decision-making support,  crisis assistance and education on issues families face every day  Help in a caller's preferred language using our translation  service that features more than 200 languages and dialects  Referrals to local community programs, services and ongoing support     FALL PRECAUTIONS: Be cautious when walking. Scan the area for obstacles that may increase the risk of trips and falls. When getting up in the mornings, sit up at the edge of the bed for a few minutes before getting out of bed. Consider elevating the bed at the head end to avoid drop of blood pressure when getting up. Walk always in a well-lit room (use night lights in the walls). Avoid area rugs or power cords from appliances in the middle of the walkways. Use a walker or a cane if necessary and consider physical therapy for balance exercise. Get your eyesight checked regularly.  FINANCIAL OVERSIGHT: Supervision, especially oversight when making financial decisions or transactions is also recommended.  HOME SAFETY: Consider the safety of the kitchen when operating appliances like stoves, microwave oven, and blender. Consider having supervision and share cooking responsibilities until no longer able to participate in those. Accidents with firearms and other hazards in the house should be identified and addressed as well.   ABILITY TO BE LEFT ALONE: If patient is unable to contact 911 operator, consider using LifeLine, or when the need is there, arrange for someone to stay with patients. Smoking is a fire hazard, consider supervision or cessation. Risk of wandering should be assessed by caregiver and if detected at any point, supervision and safe proof recommendations should be instituted.  MEDICATION SUPERVISION: Inability to self-administer medication needs to be constantly addressed. Implement a mechanism to ensure safe administration of the medications.   DRIVING: Regarding driving, in patients with progressive memory problems, driving will be impaired. We advise to have someone else do the driving if trouble finding directions or if minor accidents are reported.  Independent driving assessment is available to determine safety of driving.   If you are interested in the driving assessment, you can contact the following:  [employer] in [place]  [place] [phone] or [phone]      [place] refers to food and lifestyle choices that are based on the traditions of countries located on the [employer]. This way of eating has been shown to help prevent certain conditions and improve outcomes for people who have chronic diseases, like kidney disease and heart disease. What are tips for following this plan? Lifestyle  Cook and eat meals together with your family, when possible. Drink enough fluid to keep your urine clear or pale yellow. Be physically active every day. This includes: Aerobic exercise like running or swimming. Leisure activities like gardening, walking, or housework. Get 7-8 hours of sleep each night. If recommended by your health care provider, drink red wine in moderation. This means 1 glass a day for nonpregnant women and 2 glasses a day for men. A glass of wine equals 5 oz (150 mL). Reading food labels  Check the serving size of packaged foods. For foods such as rice and pasta, the serving size refers to the amount of cooked product, not dry. Check the total fat in packaged foods. Avoid foods that have saturated fat or trans fats. Check the ingredients list for added sugars, such as corn syrup. Shopping  At the grocery store, buy most of your food  from the areas near the walls of the store. This includes: Fresh fruits and vegetables (produce). Grains, beans, nuts, and seeds. Some of these may be available in unpackaged forms or large amounts (in bulk). Fresh seafood. Poultry and eggs. Low-fat dairy products. Buy whole ingredients instead of prepackaged foods. Buy fresh  fruits and vegetables in-season from local farmers markets. Buy frozen fruits and vegetables in resealable bags. If you do not have access to quality fresh seafood, buy precooked frozen shrimp or canned fish, such as tuna, salmon, or sardines. Buy small amounts of raw or cooked vegetables, salads, or olives from the deli or salad bar at your store. Stock your pantry so you always have certain foods on hand, such as olive oil, canned tuna, canned tomatoes, rice, pasta, and beans. Cooking  Cook foods with extra-virgin olive oil instead of using butter or other vegetable oils. Have meat as a side dish, and have vegetables or grains as your main dish. This means having meat in small portions or adding small amounts of meat to foods like pasta or stew. Use beans or vegetables instead of meat in common dishes like chili or lasagna. Experiment with different cooking methods. Try roasting or broiling vegetables instead of steaming or sauteing them. Add frozen vegetables to soups, stews, pasta, or rice. Add nuts or seeds for added healthy fat at each meal. You can add these to yogurt, salads, or vegetable dishes. Marinate fish or vegetables using olive oil, lemon juice, garlic, and fresh herbs. Meal planning  Plan to eat 1 vegetarian meal one day each week. Try to work up to 2 vegetarian meals, if possible. Eat seafood 2 or more times a week. Have healthy snacks readily available, such as: Vegetable sticks with hummus. Greek yogurt. Fruit and nut trail mix. Eat balanced meals throughout the week. This includes: Fruit: 2-3 servings a day Vegetables: 4-5 servings a day Low-fat dairy: 2 servings a day Fish, poultry, or lean meat: 1 serving a day Beans and legumes: 2 or more servings a week Nuts and seeds: 1-2 servings a day Whole grains: 6-8 servings a day Extra-virgin olive oil: 3-4 servings a day Limit red meat and sweets to only a few servings a month What are my food choices? Mediterranean  diet Recommended Grains: Whole-grain pasta. Brown rice. Bulgar wheat. Polenta. Couscous. Whole-wheat bread. [place]. Vegetables: Artichokes. Beets. Broccoli. Cabbage. Carrots. Eggplant. Green beans. Chard. Kale. Spinach. Onions. Leeks. Peas. Squash. Tomatoes. Peppers. Radishes. Fruits: Apples. Apricots. Avocado. Berries. Bananas. Cherries. Dates. Figs. Grapes. Lemons. Melon. Oranges. Peaches. Plums. Pomegranate. Meats and other protein foods: Beans. Almonds. Sunflower seeds. Pine nuts. Peanuts. [place]. Salmon. Scallops. Shrimp. [place]. Tilapia. Clams. Oysters. Eggs. Dairy: Low-fat milk. Cheese. Greek yogurt. Beverages: Water. Red wine. Herbal tea. Fats and oils: Extra virgin olive oil. Avocado oil. Grape seed oil. Sweets and desserts: [place] yogurt with honey. Baked apples. Poached pears. Trail mix. Seasoning and other foods: Basil. Cilantro. Coriander. Cumin. Mint. Parsley. Sage. Rosemary. Tarragon. Garlic. Oregano. Thyme. Pepper. Balsalmic vinegar. Tahini. Hummus. Tomato sauce. Olives. Mushrooms. Limit these Grains: Prepackaged pasta or rice dishes. Prepackaged cereal with added sugar. Vegetables: Deep fried potatoes (french fries). Fruits: Fruit canned in syrup. Meats and other protein foods: Beef. Pork. Lamb. Poultry with skin. Hot dogs. [place] Salines. Dairy: Ice cream. Sour cream. Whole milk. Beverages: Juice. Sugar-sweetened soft drinks. Beer. Liquor and spirits. Fats and oils: Butter. Canola oil. Vegetable oil. Beef fat (tallow). Lard. Sweets and desserts: Cookies. Cakes. Pies. Candy. Seasoning and other foods: Mayonnaise. Premade  sauces and marinades. The items listed may not be a complete list. Talk with your dietitian about what dietary choices are right for you. Summary The Mediterranean diet includes both food and lifestyle choices. Eat a variety of fresh fruits and vegetables, beans, nuts, seeds, and whole grains. Limit the amount of red meat and sweets that you eat. Talk with your  health care provider about whether it is safe for you to drink red wine in moderation. This means 1 glass a day for nonpregnant women and 2 glasses a day for men. A glass of wine equals 5 oz (150 mL). This information is not intended to replace advice given to you by your health care provider. Make sure you discuss any questions you have with your health care provider. Document Released: 04/01/2016 Document Revised: 05/04/2016 Document Reviewed: 04/01/2016 Elsevier Interactive Patient Education  2017 [employer].

## 2022-04-10 ENCOUNTER — Other Ambulatory Visit: Payer: Self-pay | Admitting: Physician Assistant

## 2022-04-12 ENCOUNTER — Encounter: Payer: Self-pay | Admitting: Internal Medicine

## 2022-04-16 ENCOUNTER — Ambulatory Visit: Payer: PPO

## 2022-06-09 ENCOUNTER — Encounter: Payer: Self-pay | Admitting: Psychology

## 2022-06-09 ENCOUNTER — Ambulatory Visit: Payer: PPO | Admitting: Psychology

## 2022-06-09 ENCOUNTER — Ambulatory Visit: Payer: PPO

## 2022-06-09 DIAGNOSIS — F0280 Dementia in other diseases classified elsewhere without behavioral disturbance: Secondary | ICD-10-CM | POA: Insufficient documentation

## 2022-06-09 DIAGNOSIS — R4189 Other symptoms and signs involving cognitive functions and awareness: Secondary | ICD-10-CM

## 2022-06-09 DIAGNOSIS — G309 Alzheimer's disease, unspecified: Secondary | ICD-10-CM | POA: Diagnosis not present

## 2022-06-09 HISTORY — DX: Dementia in other diseases classified elsewhere, unspecified severity, without behavioral disturbance, psychotic disturbance, mood disturbance, and anxiety: F02.80

## 2022-06-09 NOTE — Progress Notes (Signed)
   Psychometrician Note   Cognitive testing was administered to Madison Wright by Cruzita Lederer, B.S. (psychometrist) under the supervision of Dr. Christia Reading, Ph.D., licensed psychologist on 06/09/2022. Madison Wright did not appear overtly distressed by the testing session per behavioral observation or responses across self-report questionnaires. Rest breaks were offered.    The battery of tests administered was selected by Dr. Christia Reading, Ph.D. with consideration to Madison Wright's current level of functioning, the nature of her symptoms, emotional and behavioral responses during interview, level of literacy, observed level of motivation/effort, and the nature of the referral question. This battery was communicated to the psychometrist. Communication between Dr. Christia Reading, Ph.D. and the psychometrist was ongoing throughout the evaluation and Dr. Christia Reading, Ph.D. was immediately accessible at all times. Dr. Christia Reading, Ph.D. provided supervision to the psychometrist on the date of this service to the extent necessary to assure the quality of all services provided.    Madison Wright will return within approximately 1-2 weeks for an interactive feedback session with Dr. Melvyn Novas at which time her test performances, clinical impressions, and treatment recommendations will be reviewed in detail. Madison Wright understands she can contact our office should she require our assistance before this time.  A total of 110 minutes of billable time were spent face-to-face with Madison Wright by the psychometrist. This includes both test administration and scoring time. Billing for these services is reflected in the clinical report generated by Dr. Christia Reading, Ph.D.  This note reflects time spent with the psychometrician and does not include test scores or any clinical interpretations made by Dr. Melvyn Novas. The full report will follow in a separate note.

## 2022-06-09 NOTE — Progress Notes (Signed)
NEUROPSYCHOLOGICAL EVALUATION . [facility] [facility] Department of Neurology  Date of Evaluation: June 09, 2022  Reason for Referral:   Madison Wright is a 76 y.o. right-handed Caucasian female referred by Sharene Butters, PA-C, to characterize her current cognitive functioning and assist with diagnostic clarity and treatment planning in the context of a prior diagnosis of mild cognitive impairment with concerns for Alzheimer's disease and progressive further decline.   Assessment and Plan:   Clinical Impression(s): Madison Wright pattern of performance is suggestive of profound impairment surrounding all aspects of learning and memory. Additional impairments were exhibited across executive functioning and visuospatial abilities. Performance variability was exhibited across processing speed and confrontation naming, while verbal fluency exhibited a relative weakness. Performances were age-appropriate across attention/concentration, safety/judgment, and receptive language. Madison Wright sister noted that she must remind Madison Wright to take her medications daily and has fully taken over financial management and bill paying. Madison Wright has greatly limited driving, has gotten lost in the past, and her sister uses a tracker to keep track of her whereabouts. Given ongoing cognitive and functional impairment, Madison Wright meets diagnostic criteria for a Major Neurocognitive Disorder ("dementia") at the present time.  Relative to previous testing, primary performance declines were seen across executive functioning, as well as processing speed and semantic fluency to a lesser extent. All other performances were generally stable. Regarding memory, Madison Wright was essentially amnestic across both evaluations and retention rates did exhibit mild decline (i.e., 0 - 33% versus 0 - 9%).  Regarding etiology, I continue to have primary concerns surrounding Alzheimer's disease. Madison Wright was largely  amnestic across assessed memory tasks and performed very poorly across yes/no recognition items. This suggests the presence rapid forgetting and a significant memory storage deficit, both of which represent hallmark characteristics of this illness. Additional dysfunction surrounding confrontation naming, semantic fluency, visuospatial abilities, and executive functioning would follow typical disease progression. Furthermore, recent neuroimaging suggested more pronounced temporal lobe atrophy, which is a known risk factor for the presence of this illness. Taken together, Alzheimer's disease appears most likely. There does appear to be evidence to suggest somewhat slow decline over time and there does remain the potential that more acute changes could be related to ongoing grief. Continued medical monitoring will be important moving forward.  She does not demonstrate behavioral characteristics concerning for Lewy body dementia, Parkinson's disease or other more rare illnesses in this family, or frontotemporal dementia at the present time. Neuroimaging also did not reveal prominent cerebrovascular disease, making a vascular dementia presentation unlikely.  Recommendations: [name] has already been prescribed a medication aimed to address memory loss and concerns surrounding Alzheimer's disease (i.e., memantine/Namenda). She is encouraged to continue taking this medication as prescribed. It is important to highlight that this medication has been shown to slow functional decline in some individuals. There is no current treatment which can stop or reverse cognitive decline when caused by a neurodegenerative illness.  Performance across neurocognitive testing is not a strong predictor of an individual's safety operating a motor vehicle. Should her family wish to pursue a formalized driving evaluation, they could reach out to the following agencies: [employer] in [place]: [phone] Driver  Rehabilitative Services: [facility]: [place]: [phone] or [phone]  It will be important for Madison Wright to have another person with her when in situations where she may need to process information, weigh the pros and cons of different options, and make decisions, in order  to ensure that she fully understands and recalls all information to be considered. She will likely benefit from the establishment and maintenance of a routine in order to maximize her functional abilities over time.  If not already done, Madison Wright and her family may want to discuss her wishes regarding durable power of attorney and medical decision making, so that she can have input into these choices. Additionally, they may wish to discuss future plans for caretaking and seek out community options for in home/residential care should they become necessary.  [name] is encouraged to attend to lifestyle factors for brain health (e.g., regular physical exercise, good nutrition habits, regular participation in cognitively-stimulating activities, and general stress management techniques), which are likely to have benefits for both emotional adjustment and cognition. In fact, in addition to promoting good general health, regular exercise incorporating aerobic activities (e.g., brisk walking, jogging, cycling, etc.) has been demonstrated to be a very effective treatment for depression and stress, with similar efficacy rates to both antidepressant medication and psychotherapy. Optimal control of vascular risk factors (including safe cardiovascular exercise and adherence to dietary recommendations) is encouraged. Continued participation in activities which provide mental stimulation and social interaction is also recommended.   Important information should be provided to Madison Wright in written format in all instances. This information should be placed in a highly frequented and easily visible  location within her home to promote recall. External strategies such as written notes in a consistently used memory journal, visual and nonverbal auditory cues such as a calendar on the refrigerator or appointments with alarm, such as on a cell phone, can also help maximize recall.  Review of Records:   Ms. Sweitzer was seen by [facility] Billey Gosling, M.D.) on 11/06/2020 for her annual wellness exam. During that appointment, Ms. Gastelum and her husband expressed concerns surrounding short-term memory. For example, Ms. Lamore had trouble remembering upcoming appointments, as well as what days she works. Memory was said to have progressively worsened over time. No further details were provided.  She completed a comprehensive neuropsychological evaluation with myself on 02/09/2021. Results suggested prominent impairments surrounding all aspects of learning and memory. Additional impairments were exhibited across complex attention, phonemic fluency, and confrontation naming, while variability was exhibited across cognitive flexibility and visuospatial abilities. Regarding instrumental activities of daily living (ADLs), Ms. Gruver continued to drive and generally took daily vitamins independently without significant issue. However, her husband did state that he has had to take a far more active role in financial management and bill paying due to suspected cognitive decline. She was ultimately diagnosed with a mild neurocognitive disorder ("mild cognitive impairment"). It was stressed that she was towards the moderate-severe end of this spectrum and at risk for transitioning to a dementia designation in the future. Primary concerns for the underlying etiology surrounded Alzheimer's disease given amnestic memory performances with evidence for impaired storage.  She most recently met with [facility] Sharene Butters, [place]) on 03/12/2022 for follow-up of memory decline. Her husband had passed away about one  week prior to that appointment and Ms. Rademaker was noted to be quite distressed. Her sister reported continued progressive cognitive and functional decline over time. Performance on a brief cognitive screening instrument (MOCA) was 18/30. Ultimately, Ms. Mcgraw was referred for a repeat neuropsychological evaluation to characterize her cognitive abilities and to assist with diagnostic clarity and treatment planning.   Brain MRI on 03/11/2021 revealed moderate atrophy with mild temporal lobe predominance, mild for age microvascular ischemic  changes, and a suspected remote lacunar infarct involving the left basal ganglia.   Past Medical History:  Diagnosis Date   Dementia due to Alzheimer's disease 06/09/2022   Diverticulosis of colon    Diverticulosis of large intestine 01/07/2010   Granuloma annulare 2008   Menopausal state 11/2000   Took HRT X 10 days only  in 05/2001 secondary to [facility] breast ca   Osteoporosis    Post-menopausal atrophic vaginitis 07/2005   Prediabetes 11/20/2015   Vitamin B12 deficiency 11/06/2020   Vitamin D deficiency     Past Surgical History:  Procedure Laterality Date   COLONOSCOPY  2004  & 2014   diverticulosis, Dr Olevia Perches   SKIN CANCER EXCISION  2003   Basal skin   WRIST FRACTURE SURGERY  2012   [facility]    Current Outpatient Medications:    alendronate (FOSAMAX) 70 MG tablet, Take 1 tablet (70 mg total) by mouth every 7 (seven) days. Take with a full glass of water on an empty stomach., Disp: 12 tablet, Rfl: 3   memantine (NAMENDA) 10 MG tablet, TAKE ONE TAB TWO TIMES A DAY, Disp: 180 tablet, Rfl: 4   Multiple Vitamin (MULTIVITAMIN) tablet, Take 1 tablet by mouth daily., Disp: , Rfl:    Probiotic Product (ALIGN PO), Take by mouth., Disp: , Rfl:   Clinical Interview:   The following information was obtained during a clinical interview with Ms. Valente and her sister prior to cognitive testing.  Cognitive Symptoms: Decreased short-term memory: Endorsed. Ms.  Claude previously reported generalized symptoms of short-term memory loss. Her husband and sister described her having trouble recalling the details of previous conversations, having trouble recalling names of family/friends who she does not see regularly, and her misplacing/losing things often. Her husband previously added that Ms. Deyarmin will often have to ask him if she works that day. During the current interview, Ms. Anspaugh was largely unsure if decline has occurred. Her sister did report significant cognitive decline since the previous evaluation.  Decreased long-term memory: Denied. Decreased attention/concentration: Endorsed. Her sister previously noted that Ms. Nolton will have some trouble focusing and seems to get distracted more easily than typical. This was said to have continued and progressively worsened since previous testing. Reduced processing speed: Endorsed. Difficulties with executive functions: Endorsed. Ms. Manske husband and sister previously described a notable change in her organizational abilities, as well as increased indecisiveness. This was said to have remained. Her sister added that Ms. Reames often starts tasks but leaves them unfinished, further worsening organizational abilities. Difficulties with emotion regulation: Denied. During the current evaluation, her sister did add that Ms. Jersey has become far more irritable and easily agitated since previous testing. This culminates in Ms. Larin cursing at others, which was said to be quite out of character.  Difficulties with receptive language: Denied. Difficulties with word finding: Denied. Decreased visuoperceptual ability: Denied.   Difficulties completing ADLs: Endorsed. Her sister provides daily reminder phone calls in the mornings and evenings to ensure that Ms. Akerley takes medications/vitamins. Without these calls, medication adherence would likely be very poor. Her sister has fully taken over financial management  and bill paying. She has also hired individuals to care for and spend time with Ms. Llorente for several hours several days per week. Ms. Goodhart continues to drive locally. However, she has gotten lost and her sister has placed a tracking device on her vehicle as a result.  Additional Medical History: History of traumatic brain injury/concussion: Denied. History of stroke:  Denied. History of seizure activity: Denied. History of known exposure to toxins: Denied. Symptoms of chronic pain: Denied. Her sister previously noted a remote history of a left wrist fracture but Ms. Etheredge did not endorse acute pain stemming from this event.  Experience of frequent headaches/migraines: Denied. Frequent instances of dizziness/vertigo: Denied.   Sensory changes: She wears glasses with positive effect. No other sensory changes/difficulties (e.g., hearing, taste, or smell) were reported.  Balance/coordination difficulties: Denied. She also denied any recent falls.  Other motor difficulties: Denied.   Sleep History: Estimated hours obtained each night: 7-8 hours.  Difficulties falling asleep: Denied. Difficulties staying asleep: Denied. Feels rested and refreshed upon awakening: Endorsed.   History of snoring: Denied. History of waking up gasping for air: Denied. Witnessed breath cessation while asleep: Denied.   History of vivid dreaming: Denied. Excessive movement while asleep: Denied. Instances of acting out her dreams: Denied.  Psychiatric/Behavioral Health History: Depression: As stated above, her husband unfortunately passed away in 04-03-2022 and Ms. Mayorga has exhibited a depressed mood since that time due to ongoing grief. Her sister noted that this grief comes in waves as Ms. Das will forget and then be reminded of her husband's passing throughout the day. Historically, they denied a longer history of depressed mood or mental health concerns. Current or remote suicidal ideation, intent, or  plan were denied.  Anxiety: Denied. Previously, Ms. Mandala and her sister reported ongoing anxiety specific to cognitive dysfunction and her husband's health. However, since her husband's passing, her sister noted that symptoms of anxiety have greatly dissipated. Mania: Denied. Trauma History: Denied. Visual/auditory hallucinations: Denied. Delusional thoughts: Denied.   Tobacco: Denied. Alcohol: She denied current alcohol consumption as well as a history of problematic alcohol abuse or dependence.  Recreational drugs: Denied.  Family History: Problem Relation Age of Onset   Arthritis Mother    Osteoporosis Mother        died after fall with fx. hips and PE   Stroke Mother        post immobilization with vertebral fracture   Migraines Mother    COPD Father    Colon cancer Paternal Aunt 85   Heart disease Paternal Aunt        X 2; both had MI > 17   Diabetes Paternal Aunt    Heart failure Paternal Grandmother    Cancer - Colon Unknown        nephew - chemo   This information was confirmed by Ms. Shelburne.  Academic/Vocational History: Highest level of educational attainment: 12 years. 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 She reported being well-rounded and did not describe any specific academic weaknesses.  History of developmental delay: Denied. History of grade repetition: Denied. Enrollment in special education courses: Denied. History of LD/ADHD: Denied.   Employment: Retired. She was working about four hours per day, three days a week at [employer] and otherwise socializing with customers at the time of her previous evaluation. 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   Evaluation Results:   Behavioral Observations: Ms. Cupit was accompanied by her sister, arrived to her appointment on time, and was appropriately  dressed and groomed. She appeared alert. Observed gait and station were within normal limits. Gross motor functioning appeared intact upon informal observation and no abnormal movements (e.g., tremors) were noted. She acknowledged  not wishing to be present for the current appointment and her sister commented that she was "here under prejudice." However, her affect was generally pleasant and she was cooperative throughout the clinical interview. Spontaneous speech was fluent and word finding difficulties were not observed during the clinical interview. Thought processes were coherent, organized, and normal in content. Insight into her cognitive difficulties appeared limited in that cognitive impairment appears quite pronounced and I do not believe Ms. Glaza has appropriate insight into the full extent of ongoing limitations.   During testing, sustained attention was appropriate. Task engagement was adequate and she persisted when challenged. She was noted to commonly repeat statements or stories to the psychometrist throughout testing without appreciation that this had already been said. She was also noted to be tearful whenever she would discuss her husband. Testing tolerance did wane as the evaluation progressed and it was mildly abbreviated in response. Overall, Ms. Dauphinee was cooperative with the clinical interview and subsequent testing procedures.   Adequacy of Effort: The validity of neuropsychological testing is limited by the extent to which the individual being tested may be assumed to have exerted adequate effort during testing. Ms. Carrasco expressed her intention to perform to the best of her abilities and exhibited adequate task engagement and persistence. Scores across stand-alone and embedded performance validity measures were within expectation. As such, the results of the current evaluation are believed to be a valid representation of Ms. Hicklin's current cognitive functioning.  Test  Results: Ms. Trivedi was disoriented at the time of the current evaluation. She incorrectly stated the current year ("2022"), month ("September"), date, day of the week, and name of the current clinic. When asked the reason for the current appointment, she responded "my sister made me."  Intellectual abilities based upon educational and vocational attainment were estimated to be in the average range. Premorbid abilities were estimated to be within the below average range based upon a single-word reading test.   Processing speed was variable, ranging from the exceptionally low to average normative ranges. Basic attention was average to well above average. More complex attention (e.g., working memory) was below average to average. Executive functioning was exceptionally low to well below average. She did perform in the average range across a task assessing safety and judgment.  Assessed receptive language abilities were below average. Ms. Jividen did not exhibit any difficulties comprehending task instructions and answered all questions asked of her adequately. Assessed expressive language was variable. Sentence repetition was average, verbal fluency (both phonemic and semantic) was well below average to below average, and confrontation naming was average across a screening task but well below average across a more comprehensive assessment.     Assessed visuospatial/visuoconstructional abilities were exceptionally low to well below average. Across her drawing of a clock, she omitted the numbers 11 and 12 and exhibited abnormalities in numerical placement. She was also unable to place the clock hands. Points were lost on her copy of a complex figure due to numerous visual distortions and some internal aspects being omitted entirely.    Learning (i.e., encoding) of novel verbal information was exceptionally low to well below average. Spontaneous delayed recall (i.e., retrieval) of previously learned information  was exceptionally low. Retention rates were 0% across a story learning task, 0% across a list learning task, and 9% across a figure drawing task. Performance across a list learning recognition task was exceptionally low, suggesting negligible evidence for information consolidation.   Results of emotional screening instruments suggested that recent symptoms  of generalized anxiety were in the minimal range, while symptoms of depression were within normal limits. A screening instrument assessing recent sleep quality suggested the presence of minimal sleep dysfunction.  Tables of Scores:   Note: This summary of test scores accompanies the interpretive report and should not be considered in isolation without reference to the appropriate sections in the text. Descriptors are based on appropriate normative data and may be adjusted based on clinical judgment. Terms such as "Within Normal Limits" and "Outside Normal Limits" are used when a more specific description of the test score cannot be determined. Descriptors refer to the current evaluation only.         Percentile - Normative Descriptor > 98 - Exceptionally High 91-97 - Well Above Average 75-90 - Above Average 25-74 - Average 9-24 - Below Average 2-8 - Well Below Average < 2 - Exceptionally Low        Validity:    DESCRIPTOR   June 2022 Current    Dot Counting Test: --- --- --- Within Normal Limits  RBANS Effort Index: --- --- --- Within Normal Limits  WAIS-IV Reliable Digit Span: -- --- --- Within Normal Limits        Orientation:       Raw Score Raw Score Percentile   NAB Orientation, Form 1 27/29 23/29 --- ---        Cognitive Screening:       Raw Score Raw Score Percentile   SLUMS: 19/30 14/30 --- ---        RBANS, Form A: Standard Score/ Scaled Score Standard Score/ Scaled Score Percentile   Total Score 63 61 <1 Exceptionally Low  Immediate Memory 65 57 <1 Exceptionally Low    List Learning 4 2 <1 Exceptionally Low    Story  Memory _0 Well Below Average  Visuospatial/Constructional 69 62 1 Exceptionally Low    Figure Copy 9 2 <1 Exceptionally Low    Line Orientation 4/20 11/20 3-9 Well Below Average  Language 71 88 21 Below Average    Picture Naming 8/10 9/10 26-50 Average    Semantic Fluency _1 Well Below Average  Attention 109 91 27 Average    Digit Span 16 14 91 Well Above Average    Coding _2 Exceptionally Low  Delayed Memory 40 48 <1 Exceptionally Low    List Recall 0/10 0/10 <2 Exceptionally Low    List Recognition 10/20 15/20 <2 Exceptionally Low    Story Recall 3 1 <1 Exceptionally Low    Figure Recall 1 2 <1 Exceptionally Low         Intellectual Functioning:       Standard Score Standard Score Percentile   Test of Premorbid Functioning: 85 84 14 Below Average        Attention/Executive Function:      Trail Making Test (TMT): Raw Score (T Score) Raw Score (T Score) Percentile     Part A 25 secs.,  0 errors (59) 31 secs.,  0 errors (55) 69 Average    Part B 107 secs.,  1 error (45) 240 secs.,  4 errors (32) 4 Well Below Average          Scaled Score Scaled Score Percentile   WAIS-IV Digit Span: _3 Average    Forward 12 10 50 Average    Backward _4 Average    Sequencing _5 Below Average  Scaled Score Scaled Score Percentile   WAIS-IV Similarities: --- 3 1 Exceptionally Low        D-KEFS Verbal Fluency Test: Raw Score (Scaled Score) Raw Score (Scaled Score) Percentile     Letter Total Correct 15 (4) 21 (6) 9 Below Average    Category Total Correct 22 (6) 23 (6) 9 Below Average    Category Switching Total Correct 7 (4) 5 (2) <1 Exceptionally Low    Category Switching Accuracy 6 (5) 2 (1) <1 Exceptionally Low      Total Set Loss Errors 2 (10) 4 (8) 25 Average      Total Repetition Errors 8 (5) 14 (1) <1 Exceptionally Low        NAB Executive Functions Module, Form 1: T Score T Score Percentile     Judgment 50 50 50 Average        Language:       Raw  Score Raw Score Percentile   Sentence Repetition: --- 14/22 27 Average        Verbal Fluency Test: Raw Score (T Score) Raw Score (T Score) Percentile     Phonemic Fluency (FAS) 15 (29) 21 (32) 4 Well Below Average    Animal Fluency 12 (37) 9 (28) 2 Well Below Average         NAB Language Module, Form 1: T Score T Score Percentile     Auditory Comprehension 56 39 14 Below Average    Naming 24/31 (29) 25/31 (33) 5 Well Below Average        Visuospatial/Visuoconstruction:       Raw Score Raw Score Percentile   Clock Drawing: 4/10 5/10 --- Impaired        Mood and Personality:       Raw Score Raw Score Percentile   Geriatric Depression Scale: 1 8 --- Within Normal Limits  Geriatric Anxiety Scale: 2 2 --- Minimal    Somatic 1 1 --- Minimal    Cognitive 1 1 --- Minimal    Affective 0 0 --- Minimal        Additional Questionnaires:       Raw Score Raw Score Percentile   PROMIS Sleep Disturbance Questionnaire: 9 8 --- None to Slight   Informed Consent and Coding/Compliance:   The current evaluation represents a clinical evaluation for the purposes previously outlined by the referral source and is in no way reflective of a forensic evaluation.   Ms. Dunbar was provided with a verbal description of the nature and purpose of the present neuropsychological evaluation. Also reviewed were the foreseeable risks and/or discomforts and benefits of the procedure, limits of confidentiality, and mandatory reporting requirements of this provider. The patient was given the opportunity to ask questions and receive answers about the evaluation. Oral consent to participate was provided by the patient.   This evaluation was conducted by Christia Reading, Ph.D., ABPP-CN, board certified clinical neuropsychologist. Ms. Viscuso completed a clinical interview with Dr. Melvyn Novas, billed as one unit [phone], and 110 minutes of cognitive testing and scoring, billed as one unit [phone] and three additional units [phone].  Psychometrist Cruzita Lederer, B.S., assisted Dr. Melvyn Novas with test administration and scoring procedures. As a separate and discrete service, Dr. Melvyn Novas spent a total of 160 minutes in interpretation and report writing billed as one unit [phone] and two units [id number].

## 2022-06-16 ENCOUNTER — Ambulatory Visit: Payer: PPO | Admitting: Psychology

## 2022-06-16 DIAGNOSIS — F0280 Dementia in other diseases classified elsewhere without behavioral disturbance: Secondary | ICD-10-CM

## 2022-06-16 DIAGNOSIS — G309 Alzheimer's disease, unspecified: Secondary | ICD-10-CM

## 2022-06-16 NOTE — Progress Notes (Signed)
   Neuropsychology Feedback Session Madison Wright. [facility]  Reason for Referral:   Madison Wright is a 76 y.o. right-handed Caucasian female referred by Sharene Butters, PA-C, to characterize her current cognitive functioning and assist with diagnostic clarity and treatment planning in the context of a prior diagnosis of mild cognitive impairment with concerns for Alzheimer's disease and progressive further decline.  Feedback:   Madison Wright completed a comprehensive neuropsychological evaluation on 06/09/2022. Please refer to that encounter for the full report and recommendations. Briefly, results suggested profound impairment surrounding all aspects of learning and memory. Additional impairments were exhibited across executive functioning and visuospatial abilities. Performance variability was exhibited across processing speed and confrontation naming, while verbal fluency exhibited a relative weakness. Regarding etiology, I continue to have primary concerns surrounding Alzheimer's disease. Madison Wright was largely amnestic across assessed memory tasks and performed very poorly across yes/no recognition items. This suggests the presence rapid forgetting and a significant memory storage deficit, both of which represent hallmark characteristics of this illness. Additional dysfunction surrounding confrontation naming, semantic fluency, visuospatial abilities, and executive functioning would follow typical disease progression. Furthermore, recent neuroimaging suggested more pronounced temporal lobe atrophy, which is a known risk factor for the presence of this illness. Taken together, Alzheimer's disease appears most likely. There does appear to be evidence to suggest somewhat slow decline over time and there does remain the potential that more acute changes could be related to ongoing grief. Continued medical monitoring will be important moving forward.  At the request  and advice of Madison Wright, a telephone feedback was conducted with Madison Wright. Madison Wright previously signed a DPA to allow for such communication. Madison Wright was within her residence while I was within my office. I discussed the limitations of evaluation and management by telemedicine and the availability of in person appointments. Madison Wright expressed her understanding and agreed to proceed. Content of the current session focused on the results of Madison Wright neuropsychological evaluation. Madison Wright was given the opportunity to ask questions and her questions were answered. She and Madison Wright were encouraged to reach out should additional questions arise. Madison Wright report is available on MyChart, which Madison Wright has access to.      23 minutes were spent conducting the current feedback session with Madison Wright. However, as Madison Wright was not present, no billing was performed.

## 2022-07-20 ENCOUNTER — Telehealth: Payer: Self-pay | Admitting: Internal Medicine

## 2022-07-20 MED ORDER — "SYRINGE 25G X 1"" 3 ML MISC": 3 refills | Status: DC

## 2022-07-20 MED ORDER — CYANOCOBALAMIN 1000 MCG/ML IJ SOLN: 1000.0000 ug | INTRAMUSCULAR | 0 refills | Status: DC

## 2022-07-20 NOTE — Telephone Encounter (Signed)
Patient's sister would like to know if they could get an rx for B12 injections.  They have  nurse in the family willing to do the injections instead of having to come to [place].  Please advise.

## 2022-07-20 NOTE — Telephone Encounter (Signed)
Spoke with sister today.

## 2022-07-20 NOTE — Telephone Encounter (Signed)
Prescription sent to pharmacy.

## 2022-10-08 ENCOUNTER — Encounter: Payer: Self-pay | Admitting: Internal Medicine

## 2022-10-13 ENCOUNTER — Encounter: Payer: Self-pay | Admitting: Internal Medicine

## 2022-10-13 MED ORDER — ESCITALOPRAM OXALATE 5 MG PO TABS: 5.0000 mg | ORAL_TABLET | Freq: Every day | ORAL | 5 refills | Status: DC

## 2022-11-06 ENCOUNTER — Other Ambulatory Visit: Payer: Self-pay | Admitting: Internal Medicine

## 2022-11-15 ENCOUNTER — Encounter: Payer: PPO | Admitting: Psychology

## 2022-11-18 ENCOUNTER — Encounter: Payer: PPO | Admitting: Internal Medicine

## 2022-11-21 ENCOUNTER — Encounter: Payer: Self-pay | Admitting: Internal Medicine

## 2022-11-21 NOTE — Progress Notes (Unsigned)
Subjective:    Patient ID: Madison Wright, female    DOB: November 13, 1945, 77 y.o.   MRN: [id number]      HPI Shilynn is here for a Physical exam and her chronic medical problems.      Medications and allergies reviewed with patient and updated if appropriate.  Current Outpatient Medications on File Prior to Visit  Medication Sig Dispense Refill   alendronate (FOSAMAX) 70 MG tablet Take 1 tablet (70 mg total) by mouth every 7 (seven) days. Take with a full glass of water on an empty stomach. 12 tablet 3   cyanocobalamin (VITAMIN B12) 1000 MCG/ML injection Inject 1 mL (1,000 mcg total) into the muscle every 30 (thirty) days. 10 mL 0   escitalopram (LEXAPRO) 5 MG tablet TAKE 1 TABLET (5 MG TOTAL) BY MOUTH DAILY. 90 tablet 0   memantine (NAMENDA) 10 MG tablet TAKE ONE TAB TWO TIMES A DAY 180 tablet 4   Multiple Vitamin (MULTIVITAMIN) tablet Take 1 tablet by mouth daily.     Probiotic Product (ALIGN PO) Take by mouth.     Syringe/Needle, Disp, (SYRINGE 3CC/25GX1") 25G X 1" 3 ML MISC Use for monthly B12 injections 12 each 3   No current facility-administered medications on file prior to visit.    Review of Systems     Objective:  There were no vitals filed for this visit. There were no vitals filed for this visit. There is no height or weight on file to calculate BMI.  BP Readings from Last 3 Encounters:  03/12/22 (!) 158/78  11/12/21 (!) 144/72  02/26/21 (!) 143/81    Wt Readings from Last 3 Encounters:  03/12/22 132 lb (59.9 kg)  11/12/21 132 lb 3.2 oz (60 kg)  02/26/21 137 lb (62.1 kg)       Physical Exam Constitutional: She appears well-developed and well-nourished. No distress.  HENT:  Head: Normocephalic and atraumatic.  Right Ear: External ear normal. Normal ear canal and TM Left Ear: External ear normal.  Normal ear canal and TM Mouth/Throat: Oropharynx is clear and moist.  Eyes: Conjunctivae normal.  Neck: Neck supple. No tracheal deviation present. No  thyromegaly present.  No carotid bruit  Cardiovascular: Normal rate, regular rhythm and normal heart sounds.   No murmur heard.  No edema. Pulmonary/Chest: Effort normal and breath sounds normal. No respiratory distress. She has no wheezes. She has no rales.  Breast: deferred   Abdominal: Soft. She exhibits no distension. There is no tenderness.  Lymphadenopathy: She has no cervical adenopathy.  Skin: Skin is warm and dry. She is not diaphoretic.  Psychiatric: She has a normal mood and affect. Her behavior is normal.     Lab Results  Component Value Date   WBC 6.3 11/12/2021   HGB 13.0 11/12/2021   HCT 39.3 11/12/2021   PLT 289.0 11/12/2021   GLUCOSE 93 11/12/2021   CHOL 204 (H) 11/12/2021   TRIG 50.0 11/12/2021   HDL 72.70 11/12/2021   LDLDIRECT 128.2 09/12/2013   LDLCALC 122 (H) 11/12/2021   ALT 6 11/12/2021   AST 16 11/12/2021   NA 140 11/12/2021   K 3.6 11/12/2021   CL 103 11/12/2021   CREATININE 0.63 11/12/2021   BUN 9 11/12/2021   CO2 30 11/12/2021   TSH 1.30 11/12/2021   HGBA1C 6.0 11/12/2021         Assessment & Plan:   Physical exam: Screening blood work  ordered Exercise   Weight   Substance abuse  none   Reviewed recommended immunizations.   Health Maintenance  Topic Date Due   Zoster Vaccines- Shingrix (1 of 2) Never done   DEXA SCAN  01/01/2022   INFLUENZA VACCINE  03/23/2022   COVID-19 Vaccine (3 - 2023-24 season) 04/23/2022   COLONOSCOPY (Pts 45-4yrs Insurance coverage will need to be confirmed)  10/26/2022   MAMMOGRAM  01/15/2023   Medicare Annual Wellness (AWV)  02/02/2023   DTaP/Tdap/Td (3 - Td or Tdap) 08/29/2030   Pneumonia Vaccine 6+ Years old  Completed   Hepatitis C Screening  Completed   HPV VACCINES  Aged Out          See Problem List for Assessment and Plan of chronic medical problems.

## 2022-11-21 NOTE — Patient Instructions (Addendum)
Blood work was ordered.   The lab is on the first floor.    Medications changes include :   increase lexapro to 10 mg daily   A bone density was ordered for Madison Wright.     Return in about 1 year (around 11/22/2023) for Physical Exam.     Health Maintenance, Female Adopting a healthy lifestyle and getting preventive care are important in promoting health and wellness. Ask your health care provider about: The right schedule for you to have regular tests and exams. Things you can do on your own to prevent diseases and keep yourself healthy. What should I know about diet, weight, and exercise? Eat a healthy diet  Eat a diet that includes plenty of vegetables, fruits, low-fat dairy products, and lean protein. Do not eat a lot of foods that are high in solid fats, added sugars, or sodium. Maintain a healthy weight Body mass index (BMI) is used to identify weight problems. It estimates body fat based on height and weight. Your health care provider can help determine your BMI and help you achieve or maintain a healthy weight. Get regular exercise Get regular exercise. This is one of the most important things you can do for your health. Most adults should: Exercise for at least 150 minutes each week. The exercise should increase your heart rate and make you sweat (moderate-intensity exercise). Do strengthening exercises at least twice a week. This is in addition to the moderate-intensity exercise. Spend less time sitting. Even light physical activity can be beneficial. Watch cholesterol and blood lipids Have your blood tested for lipids and cholesterol at 75 years of age, then have this test every 5 years. Have your cholesterol levels checked more often if: Your lipid or cholesterol levels are high. You are older than 75 years of age. You are at high risk for heart disease. What should I know about cancer screening? Depending on your health history and family history, you may need to  have cancer screening at various ages. This may include screening for: Breast cancer. Cervical cancer. Colorectal cancer. Skin cancer. Lung cancer. What should I know about heart disease, diabetes, and high blood pressure? Blood pressure and heart disease High blood pressure causes heart disease and increases the risk of stroke. This is more likely to develop in people who have high blood pressure readings or are overweight. Have your blood pressure checked: Every 3-5 years if you are 92-62 years of age. Every year if you are 61 years old or older. Diabetes Have regular diabetes screenings. This checks your fasting blood sugar level. Have the screening done: Once every three years after age 105 if you are at a normal weight and have a low risk for diabetes. More often and at a younger age if you are overweight or have a high risk for diabetes. What should I know about preventing infection? Hepatitis B If you have a higher risk for hepatitis B, you should be screened for this virus. Talk with your health care provider to find out if you are at risk for hepatitis B infection. Hepatitis C Testing is recommended for: Everyone born from [birth year] through 1963. Anyone with known risk factors for hepatitis C. Sexually transmitted infections (STIs) Get screened for STIs, including gonorrhea and chlamydia, if: You are sexually active and are younger than 75 years of age. You are older than 75 years of age and your health care provider tells you that you are at risk for this type  of infection. Your sexual activity has changed since you were last screened, and you are at increased risk for chlamydia or gonorrhea. Ask your health care provider if you are at risk. Ask your health care provider about whether you are at high risk for HIV. Your health care provider may recommend a prescription medicine to help prevent HIV infection. If you choose to take medicine to prevent HIV, you should first get tested for  HIV. You should then be tested every 3 months for as long as you are taking the medicine. Pregnancy If you are about to stop having your period (premenopausal) and you may become pregnant, seek counseling before you get pregnant. Take 400 to 800 micrograms (mcg) of folic acid every day if you become pregnant. Ask for birth control (contraception) if you want to prevent pregnancy. Osteoporosis and menopause Osteoporosis is a disease in which the bones lose minerals and strength with aging. This can result in bone fractures. If you are 29 years old or older, or if you are at risk for osteoporosis and fractures, ask your health care provider if you should: Be screened for bone loss. Take a calcium or vitamin D supplement to lower your risk of fractures. Be given hormone replacement therapy (HRT) to treat symptoms of menopause. Follow these instructions at home: Alcohol use Do not drink alcohol if: Your health care provider tells you not to drink. You are pregnant, may be pregnant, or are planning to become pregnant. If you drink alcohol: Limit how much you have to: 0-1 drink a day. Know how much alcohol is in your drink. In the [place] one drink equals one 12 oz bottle of beer (355 mL), one 5 oz glass of wine (148 mL), or one 1 oz glass of hard liquor (44 mL). Lifestyle Do not use any products that contain nicotine or tobacco. These products include cigarettes, chewing tobacco, and vaping devices, such as e-cigarettes. If you need help quitting, ask your health care provider. Do not use street drugs. Do not share needles. Ask your health care provider for help if you need support or information about quitting drugs. General instructions Schedule regular health, dental, and eye exams. Stay current with your vaccines. Tell your health care provider if: You often feel depressed. You have ever been abused or do not feel safe at home. Summary Adopting a healthy lifestyle and getting preventive  care are important in promoting health and wellness. Follow your health care provider's instructions about healthy diet, exercising, and getting tested or screened for diseases. Follow your health care provider's instructions on monitoring your cholesterol and blood pressure. This information is not intended to replace advice given to you by your health care provider. Make sure you discuss any questions you have with your health care provider. Document Revised: 12/29/2020 Document Reviewed: 12/29/2020 Elsevier Patient Education  2023 [employer].

## 2022-11-22 ENCOUNTER — Ambulatory Visit (INDEPENDENT_AMBULATORY_CARE_PROVIDER_SITE_OTHER): Payer: PPO | Admitting: Internal Medicine

## 2022-11-22 VITALS — BP 134/80 | HR 70 | Temp 98.0°F | Ht 65.0 in | Wt 138.0 lb

## 2022-11-22 DIAGNOSIS — E538 Deficiency of other specified B group vitamins: Secondary | ICD-10-CM

## 2022-11-22 DIAGNOSIS — E559 Vitamin D deficiency, unspecified: Secondary | ICD-10-CM

## 2022-11-22 DIAGNOSIS — R7303 Prediabetes: Secondary | ICD-10-CM

## 2022-11-22 DIAGNOSIS — G309 Alzheimer's disease, unspecified: Secondary | ICD-10-CM

## 2022-11-22 DIAGNOSIS — F419 Anxiety disorder, unspecified: Secondary | ICD-10-CM | POA: Diagnosis not present

## 2022-11-22 DIAGNOSIS — Z0000 Encounter for general adult medical examination without abnormal findings: Secondary | ICD-10-CM | POA: Diagnosis not present

## 2022-11-22 DIAGNOSIS — F0280 Dementia in other diseases classified elsewhere without behavioral disturbance: Secondary | ICD-10-CM

## 2022-11-22 DIAGNOSIS — M818 Other osteoporosis without current pathological fracture: Secondary | ICD-10-CM

## 2022-11-22 DIAGNOSIS — F32A Depression, unspecified: Secondary | ICD-10-CM

## 2022-11-22 DIAGNOSIS — M25552 Pain in left hip: Secondary | ICD-10-CM | POA: Diagnosis not present

## 2022-11-22 DIAGNOSIS — F0670 Mild neurocognitive disorder due to known physiological condition without behavioral disturbance: Secondary | ICD-10-CM | POA: Diagnosis not present

## 2022-11-22 LAB — LIPID PANEL
Cholesterol: 230 mg/dL — ABNORMAL HIGH (ref 0–200)
HDL: 75.6 mg/dL (ref >39.00)
LDL Cholesterol: 137 mg/dL — ABNORMAL HIGH (ref 0–99)
NonHDL: 154.31
Total CHOL/HDL Ratio: 3
Triglycerides: 86 mg/dL (ref 0.0–149.0)
VLDL: 17.2 mg/dL (ref 0.0–40.0)

## 2022-11-22 LAB — COMPREHENSIVE METABOLIC PANEL
ALT: 10 U/L (ref 0–35)
AST: 22 U/L (ref 0–37)
Albumin: 4.5 g/dL (ref 3.5–5.2)
Alkaline Phosphatase: 86 U/L (ref 39–117)
BUN: 15 mg/dL (ref 6–23)
CO2: 27 mEq/L (ref 19–32)
Calcium: 9.5 mg/dL (ref 8.4–10.5)
Chloride: 101 mEq/L (ref 96–112)
Creatinine, Ser: 0.7 mg/dL (ref 0.40–1.20)
GFR: 84.91 mL/min (ref >60.00)
Glucose, Bld: 99 mg/dL (ref 70–99)
Potassium: 4.1 mEq/L (ref 3.5–5.1)
Sodium: 132 mEq/L — ABNORMAL LOW (ref 135–145)
Total Bilirubin: 0.6 mg/dL (ref 0.2–1.2)
Total Protein: 7.2 g/dL (ref 6.0–8.3)

## 2022-11-22 LAB — CBC WITH DIFFERENTIAL/PLATELET
Basophils Absolute: 0.1 10*3/uL (ref 0.0–0.1)
Basophils Relative: 1 % (ref 0.0–3.0)
Eosinophils Absolute: 0.1 10*3/uL (ref 0.0–0.7)
Eosinophils Relative: 0.8 % (ref 0.0–5.0)
HCT: 39.3 % (ref 36.0–46.0)
Hemoglobin: 12.9 g/dL (ref 12.0–15.0)
Lymphocytes Relative: 19.1 % (ref 12.0–46.0)
Lymphs Abs: 1.7 10*3/uL (ref 0.7–4.0)
MCHC: 32.9 g/dL (ref 30.0–36.0)
MCV: 84.9 fl (ref 78.0–100.0)
Monocytes Absolute: 0.6 10*3/uL (ref 0.1–1.0)
Monocytes Relative: 6.8 % (ref 3.0–12.0)
Neutro Abs: 6.5 10*3/uL (ref 1.4–7.7)
Neutrophils Relative %: 72.3 % (ref 43.0–77.0)
Platelets: 324 10*3/uL (ref 150.0–400.0)
RBC: 4.63 Mil/uL (ref 3.87–5.11)
RDW: 14.9 % (ref 11.5–15.5)
WBC: 9 10*3/uL (ref 4.0–10.5)

## 2022-11-22 LAB — HEMOGLOBIN A1C: Hgb A1c MFr Bld: 5.9 % (ref 4.6–6.5)

## 2022-11-22 LAB — VITAMIN B12: Vitamin B-12: 521 pg/mL (ref 211–911)

## 2022-11-22 LAB — VITAMIN D 25 HYDROXY (VIT D DEFICIENCY, FRACTURES): VITD: 34.59 ng/mL (ref 30.00–100.00)

## 2022-11-22 MED ORDER — ESCITALOPRAM OXALATE 10 MG PO TABS: 10.0000 mg | ORAL_TABLET | Freq: Every day | ORAL | 3 refills | Status: DC

## 2022-11-22 MED ORDER — CYANOCOBALAMIN 1000 MCG/ML IJ SOLN: 1000.0000 ug | INTRAMUSCULAR | 0 refills | Status: DC

## 2022-11-22 NOTE — Assessment & Plan Note (Addendum)
Chronic Following with neurology Currently on Namenda 10  mg twice daily

## 2022-11-22 NOTE — Assessment & Plan Note (Signed)
Chronic °Taking multivitamin daily °Check vitamin D level °

## 2022-11-22 NOTE — Assessment & Plan Note (Addendum)
Chronic Fosamax started 01/2020, but has been taking it intermittently over the past 3 years-currently not taking it DEXA ordered She is active Continue MVI Check vit d level Will reevaluate if Fosamax is needed after bone density

## 2022-11-22 NOTE — Assessment & Plan Note (Signed)
Chronic Did injections for a while, now taking oral B12 daily We will check B12 level

## 2022-11-22 NOTE — Assessment & Plan Note (Signed)
New Mild - likely OA Taking advil as needed

## 2022-11-22 NOTE — Assessment & Plan Note (Signed)
Chronic Has some depression and anxiety Currently taking Lexapro 5 mg daily She is still grieving her husband's death and there is some depression We will increase Lexapro to 10 mg daily

## 2022-11-22 NOTE — Assessment & Plan Note (Signed)
Chronic Check a1c Low sugar / carb diet Stressed regular exercise  

## 2022-11-25 ENCOUNTER — Encounter: Payer: PPO | Admitting: Psychology

## 2022-12-06 ENCOUNTER — Ambulatory Visit: Payer: PPO | Admitting: Physician Assistant

## 2022-12-07 ENCOUNTER — Telehealth: Payer: Self-pay | Admitting: Internal Medicine

## 2022-12-07 NOTE — Telephone Encounter (Signed)
Form faxed back today and conformation received.

## 2022-12-07 NOTE — Telephone Encounter (Signed)
Form received from [facility] for patient's bone density needs to be completed and faxed back to [phone].  Form is up front in Dr. Lawerance Bach' box.

## 2022-12-30 ENCOUNTER — Telehealth: Payer: Self-pay | Admitting: Internal Medicine

## 2022-12-30 NOTE — Telephone Encounter (Signed)
Contacted Luvenia Redden to schedule their annual wellness visit. Appointment made for 01/13/2023.  [facility] Care Guide [facility] AWV TEAM Direct Dial: [phone]

## 2022-12-30 NOTE — Telephone Encounter (Signed)
Called patient to schedule Medicare Annual Wellness Visit (AWV). No voicemail available to leave a message.  Last date of AWV: 0612/2023  Please schedule an appointment at any time with [facility].  If any questions, please contact me at [phone].  Thank you ,  Randon Goldsmith Care Guide [facility] AWV TEAM Direct Dial: [phone]

## 2022-12-31 DIAGNOSIS — N951 Menopausal and female climacteric states: Secondary | ICD-10-CM | POA: Diagnosis not present

## 2022-12-31 DIAGNOSIS — Z8262 Family history of osteoporosis: Secondary | ICD-10-CM | POA: Diagnosis not present

## 2022-12-31 LAB — HM DEXA SCAN

## 2023-01-03 ENCOUNTER — Encounter: Payer: Self-pay | Admitting: Internal Medicine

## 2023-01-07 ENCOUNTER — Encounter: Payer: Self-pay | Admitting: Internal Medicine

## 2023-01-07 MED ORDER — ALENDRONATE SODIUM 70 MG PO TABS
70.0000 mg | ORAL_TABLET | ORAL | 3 refills | Status: DC
Start: 2023-01-07 — End: 2023-12-19

## 2023-01-12 NOTE — Progress Notes (Unsigned)
I connected with the patient caregiver Alease Frame who is listed on the patient DPR. concerning Madison Wright on 01/13/2023 by a audio enabled telemedicine application and verified that I am speaking with the correct person using two identifiers. 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 The patient expressed understanding and agreed to proceed.     Subjective:   Madison Wright is a 77 y.o. female who presents for Medicare Annual (Subsequent) preventive examination.  Review of Systems    Per HPI unless specifically indicated below.  Cardiac Risk Factors include: advanced age (>108men, >42 women);female gender          Objective:       11/22/2022    3:13 PM 11/22/2022    2:29 PM 03/12/2022    9:11 AM  Vitals with BMI  Height  5\' 5"  5\' 5"   Weight  138 lbs 132 lbs  BMI  22.96 21.97  Systolic 134 146 161  Diastolic 80 90 78  Pulse  70 70    There were no vitals filed for this visit. There is no height or weight on file to calculate BMI.     03/12/2022    9:10 AM 02/01/2022    8:58 AM 01/08/2021    9:58 AM 04/30/2015    9:09 AM  Advanced Directives  Does Patient Have a Medical Advance Directive? Yes Yes Yes Yes  Type of Special educational needs teacher of [place];Living will Living will;Healthcare Power of Attorney   Does patient want to make changes to medical advance directive?   No - Patient declined   Copy of Healthcare Power of Attorney in Chart?  No - copy requested No - copy requested Yes    Current Medications (verified) Outpatient Encounter Medications as of 01/13/2023  Medication Sig   alendronate (FOSAMAX) 70 MG tablet Take 1 tablet (70 mg total) by mouth every 7 (seven) days. Take with a full glass of water on an empty stomach.   cyanocobalamin (VITAMIN B12) 1000 MCG/ML injection Inject 1 mL (1,000 mcg  total) into the muscle every 30 (thirty) days.   escitalopram (LEXAPRO) 10 MG tablet Take 1 tablet (10 mg total) by mouth daily.   memantine (NAMENDA) 10 MG tablet TAKE ONE TAB TWO TIMES A DAY   Multiple Vitamin (MULTIVITAMIN) tablet Take 1 tablet by mouth daily.   Syringe/Needle, Disp, (SYRINGE 3CC/25GX1") 25G X 1" 3 ML MISC Use for monthly B12 injections   Probiotic Product (ALIGN PO) Take by mouth. (Patient not taking: Reported on 01/13/2023)   No facility-administered encounter medications on file as of 01/13/2023.    Allergies (verified) Sulfonamide derivatives and Codeine   History: Past Medical History:  Diagnosis Date   Dementia due to Alzheimer's disease 06/09/2022   Diverticulosis of colon    Diverticulosis of large intestine 01/07/2010   Granuloma annulare 2008   Menopausal state 11/2000   Took HRT X 10 days only  in 05/2001 secondary to [facility] breast ca   Osteoporosis    Post-menopausal atrophic vaginitis 07/2005   Prediabetes 11/20/2015   Vitamin B12 deficiency 11/06/2020   Vitamin D deficiency    Past Surgical History:  Procedure Laterality Date   COLONOSCOPY  2004  & 2014   diverticulosis, Dr Juanda Chance   SKIN CANCER EXCISION  2003   Basal skin   WRIST FRACTURE SURGERY  2012  [facility]   Family History  Problem Relation Age of Onset   Arthritis Mother    Osteoporosis Mother        died after fall with fx. hips and PE   Stroke Mother        post immobilization with vertebral fracture   Migraines Mother    COPD Father    Colon cancer Paternal Aunt 64   Heart disease Paternal Aunt        X 2; both had MI > 71   Diabetes Paternal Aunt    Heart failure Paternal Grandmother    Cancer - Colon Unknown        nephew - chemo   Social History   Socioeconomic History   Marital status: Widowed    Spouse name: Markisha Ostenson   Number of children: 0   Years of education: 12   Highest education level: High school graduate  Occupational History   Occupation:  Retired  Tobacco Use   Smoking status: Never   Smokeless tobacco: Never  Vaping Use   Vaping Use: Never used  Substance and Sexual Activity   Alcohol use: No    Alcohol/week: 0.0 standard drinks of alcohol   Drug use: No   Sexual activity: Not Currently    Partners: Male    Birth control/protection: Post-menopausal  Other Topics Concern   Not on file  Social History Narrative   exercises   Social Determinants of Health   Financial Resource Strain: Low Risk  (01/13/2023)   Overall Financial Resource Strain (CARDIA)    Difficulty of Paying Living Expenses: Not hard at all  Food Insecurity: No Food Insecurity (01/13/2023)   Hunger Vital Sign    Worried About Running Out of Food in the Last Year: Never true    Ran Out of Food in the Last Year: Never true  Transportation Needs: No Transportation Needs (01/13/2023)   PRAPARE - Administrator, Civil Service (Medical): No    Lack of Transportation (Non-Medical): No  Physical Activity: Inactive (01/13/2023)   Exercise Vital Sign    Days of Exercise per Week: 0 days    Minutes of Exercise per Session: 0 min  Stress: No Stress Concern Present (01/13/2023)   [employer] of Occupational Health - Occupational Stress Questionnaire    Feeling of Stress : Not at all  Social Connections: Moderately Integrated (01/13/2023)   Social Connection and Isolation Panel [NHANES]    Frequency of Communication with Friends and Family: More than three times a week    Frequency of Social Gatherings with Friends and Family: More than three times a week    Attends Religious Services: More than 4 times per year    Active Member of [employer] or Organizations: Yes    Attends Banker Meetings: More than 4 times per year    Marital Status: Widowed    Tobacco Counseling Counseling given: No   Clinical Intake:  Pre-visit preparation completed: No  Pain : No/denies pain     Nutritional Status: BMI of 19-24  Normal Nutritional  Risks: Unintentional weight loss Diabetes: No  How often do you need to have someone help you when you read instructions, pamphlets, or other written materials from your doctor or pharmacy?: 5 - Always  Diabetic?No  Interpreter Needed?: No  Information entered by :: Laurel Dimmer, CMA   Activities of Daily Living    01/13/2023    3:41 PM 02/01/2022    8:59 AM  In  your present state of health, do you have any difficulty performing the following activities:  Hearing? 0 0  Vision? 0 0  Difficulty concentrating or making decisions? 1 0  Walking or climbing stairs? 1 0  Dressing or bathing? 0 0  Doing errands, shopping? 0 0  Preparing Food and eating ?  N  Using the Toilet?  N  In the past six months, have you accidently leaked urine?  N  Do you have problems with loss of bowel control?  N  Managing your Medications?  N  Managing your Finances?  N    Patient Care Team: Pincus Sanes, MD as PCP - General (Internal Medicine) Dingeldein, Viviann Spare, MD as Consulting Physician (Ophthalmology)  Indicate any recent Medical Services you may have received from other than [facility] providers in the past year (date may be approximate).     Assessment:   This is a routine wellness examination for Madison Wright.   Hearing/Vision screen Denies any hearing issues. Denies any change to her vision. Wear glasses. Annual Eye Exam.    Dietary issues and exercise activities discussed:     Goals Addressed   None    Depression Screen    11/22/2022    2:41 PM 02/01/2022    8:59 AM 02/01/2022    8:57 AM 01/08/2021    9:57 AM 08/29/2020    8:30 AM 03/15/2019    8:42 AM 08/31/2017    7:48 AM  PHQ 2/9 Scores  PHQ - 2 Score 2 0 0 0 0 0 0  PHQ- 9 Score 5          Fall Risk    01/13/2023    3:40 PM 11/22/2022    2:41 PM 03/12/2022    9:10 AM 02/01/2022    8:59 AM 01/08/2021    9:58 AM  Fall Risk   Falls in the past year? 1 1 0 0 0  Number falls in past yr: 0 0 0 0 0  Injury with Fall? 0 0 0 0 0  Risk for  fall due to : Impaired balance/gait No Fall Risks  No Fall Risks No Fall Risks  Follow up Falls evaluation completed Falls evaluation completed  Falls evaluation completed Falls evaluation completed    FALL RISK PREVENTION PERTAINING TO THE HOME:  Any stairs in or around the home? Yes  If so, are there any without handrails? No  Home free of loose throw rugs in walkways, pet beds, electrical cords, etc? Yes  Adequate lighting in your home to reduce risk of falls? Yes   ASSISTIVE DEVICES UTILIZED TO PREVENT FALLS:  Life alert? No  Use of a cane, walker or w/c? No  Grab bars in the bathroom? Yes  Shower chair or bench in shower? No  Elevated toilet seat or a handicapped toilet? Yes   TIMED UP AND GO:  Was the test performed? Unable to perform, virtual appointment   Cognitive Function:  Pt diagnose with dementia due to Alzheimer's disease. Unable to assess cognition.     03/12/2022    2:00 PM  Montreal Cognitive Assessment   Visuospatial/ Executive (0/5) 1  Naming (0/3) 3  Attention: Read list of digits (0/2) 2  Attention: Read list of letters (0/1) 1  Attention: Serial 7 subtraction starting at 100 (0/3) 2  Language: Repeat phrase (0/2) 2  Language : Fluency (0/1) 0  Abstraction (0/2) 1  Delayed Recall (0/5) 2  Orientation (0/6) 4  Total 18  Adjusted Score (based on  education) 18      Immunizations Immunization History  Administered Date(s) Administered   Fluad Quad(high Dose 65+) 05/18/2019, 06/18/2021   Influenza Split 08/08/2012   Influenza, High Dose Seasonal PF 06/13/2013, 04/30/2015, 06/02/2016, 06/11/2017, 06/16/2018   Influenza,inj,Quad PF,6+ Mos 06/13/2014   Moderna Sars-Covid-2 Vaccination 09/28/2019, 10/24/2019   Pneumococcal Conjugate-13 04/30/2015   Pneumococcal Polysaccharide-23 09/12/2013   Td 03/06/2008   Tdap 08/29/2020   Zoster, Live 03/03/2012    TDAP status: Up to date  Flu Vaccine status: Up to date  Pneumococcal vaccine status: Up to  date  Covid-19 vaccine status: Information provided on how to obtain vaccines.   Qualifies for Shingles Vaccine? Yes   Zostavax completed No   Shingrix Completed?: No.    Education has been provided regarding the importance of this vaccine. Patient has been advised to call insurance company to determine out of pocket expense if they have not yet received this vaccine. Advised may also receive vaccine at local pharmacy or Health Dept. Verbalized acceptance and understanding.  Screening Tests Health Maintenance  Topic Date Due   Zoster Vaccines- Shingrix (1 of 2) Never done   COVID-19 Vaccine (3 - 2023-24 season) 04/23/2022   Colonoscopy  11/22/2023 (Originally 10/26/2022)   MAMMOGRAM  01/15/2023   INFLUENZA VACCINE  03/24/2023   Medicare Annual Wellness (AWV)  01/13/2024   DEXA SCAN  12/30/2024   DTaP/Tdap/Td (3 - Td or Tdap) 08/29/2030   Pneumonia Vaccine 89+ Years old  Completed   Hepatitis C Screening  Completed   HPV VACCINES  Aged Out    Health Maintenance  Health Maintenance Due  Topic Date Due   Zoster Vaccines- Shingrix (1 of 2) Never done   COVID-19 Vaccine (3 - 2023-24 season) 04/23/2022    Colorectal cancer screening: No longer required.   Mammogram status: No longer required due to age Last Mammogram 01/14/2022.  DEXA Scan: 12/31/2022  Lung Cancer Screening: (Low Dose CT Chest recommended if Age 38-80 years, 30 pack-year currently smoking OR have quit w/in 15years.) does not qualify.   Lung Cancer Screening Referral: not appicable   Additional Screening:  Hepatitis C Screening: does qualify; Completed 07/13/2016  Vision Screening: Recommended annual ophthalmology exams for early detection of glaucoma and other disorders of the eye. Is the patient up to date with their annual eye exam?  Yes  Who is the provider or what is the name of the office in which the patient attends annual eye exams?  If pt is not established with a provider, would they like to be  referred to a provider to establish care? No .   Dental Screening: Recommended annual dental exams for proper oral hygiene  Community Resource Referral / Chronic Care Management: CRR required this visit?  No   CCM required this visit?  No      Plan:     I have personally reviewed and noted the following in the patient's chart:   Medical and social history Use of alcohol, tobacco or illicit drugs  Current medications and supplements including opioid prescriptions. Patient is not currently taking opioid prescriptions. Functional ability and status Nutritional status Physical activity Advanced directives List of other physicians Hospitalizations, surgeries, and ER visits in previous 12 months Vitals Screenings to include cognitive, depression, and falls Referrals and appointments  In addition, I have reviewed and discussed with patient certain preventive protocols, quality metrics, and best practice recommendations. A written personalized care plan for preventive services as well as general preventive health recommendations were  provided to patient.    Madison Wright , Thank you for taking time to come for your Medicare Wellness Visit. I appreciate your ongoing commitment to your health goals. Please review the following plan we discussed and let me know if I can assist you in the future.   These are the goals we discussed:  Goals   None     This is a list of the screening recommended for you and due dates:  Health Maintenance  Topic Date Due   Zoster (Shingles) Vaccine (1 of 2) Never done   COVID-19 Vaccine (3 - 2023-24 season) 04/23/2022   Colon Cancer Screening  11/22/2023*   Mammogram  01/15/2023   Flu Shot  03/24/2023   Medicare Annual Wellness Visit  01/13/2024   DEXA scan (bone density measurement)  12/30/2024   DTaP/Tdap/Td vaccine (3 - Td or Tdap) 08/29/2030   Pneumonia Vaccine  Completed   Hepatitis C Screening  Completed   HPV Vaccine  Aged Out  *Topic was  postponed. The date shown is not the original due date.     Lonna Cobb, CMA   01/13/2023   Nurse Notes: Approximately 30 minute Non-Face -To-Face Medicare Wellness Visit

## 2023-01-12 NOTE — Patient Instructions (Signed)

## 2023-01-13 ENCOUNTER — Ambulatory Visit (INDEPENDENT_AMBULATORY_CARE_PROVIDER_SITE_OTHER): Payer: PPO

## 2023-01-13 DIAGNOSIS — Z0000 Encounter for general adult medical examination without abnormal findings: Secondary | ICD-10-CM

## 2023-01-14 ENCOUNTER — Encounter: Payer: Self-pay | Admitting: Internal Medicine

## 2023-07-06 ENCOUNTER — Encounter: Payer: Self-pay | Admitting: Internal Medicine

## 2023-07-06 IMAGING — MR MR HEAD W/O CM
11 series · 48 of 48 positions shown · non-contrast
Comparison: None.

CLINICAL DATA: Dementia, Alzheimer's suspected.

EXAM:
MRI HEAD WITHOUT CONTRAST
TECHNIQUE: Multiplanar, multiecho pulse sequences of the brain and surrounding
structures were obtained without intravenous contrast.

[Series 2: T1 · sagittal · 5.0mm · 0.45mm/px · 2 of 21 slices shown]
[im 1/21]
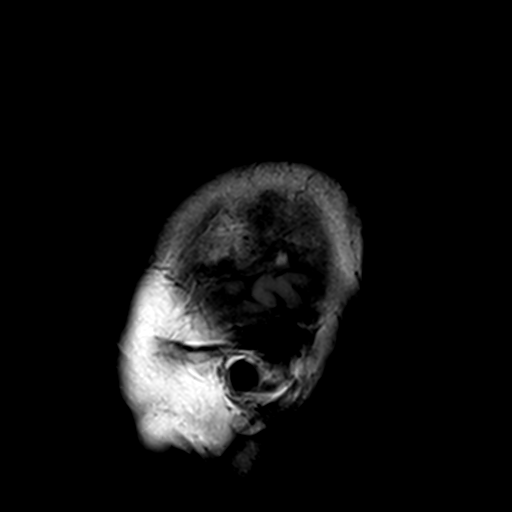
[im 21/21]
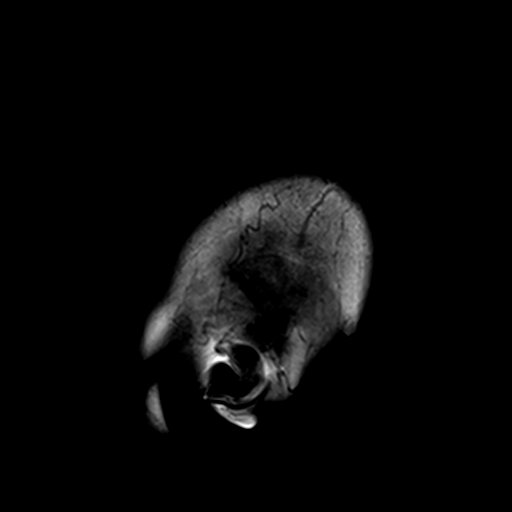

[Series 3: DWI · axial · 3.0mm · 1.80mm/px · z∈[-90,+54]mm · 8 of 97 slices shown (1 of 4)]
[im 1/97]
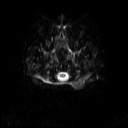
[im 14/97]
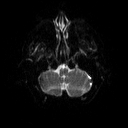
[im 28/97]
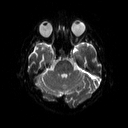
[im 42/97]
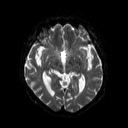
[im 55/97]
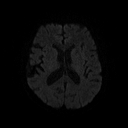
[im 69/97]
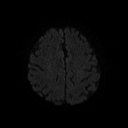
[im 83/97]
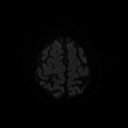
[im 97/97]
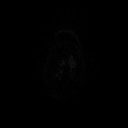

[Series 4: DWI · axial · 3.0mm · 1.80mm/px · z∈[-90,+54]mm · 4 of 50 slices shown (2 of 4)]
[im 1/50]
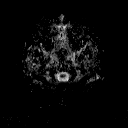
[im 17/50]
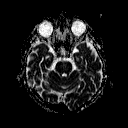
[im 33/50]
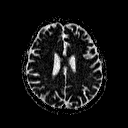
[im 50/50]
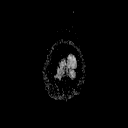

[Series 5: DWI · coronal · 5.0mm · 1.80mm/px · 6 of 70 slices shown (3 of 4)]
[im 1/70]
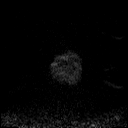
[im 14/70]
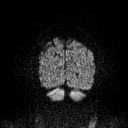
[im 28/70]
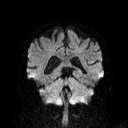
[im 42/70]
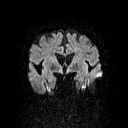
[im 56/70]
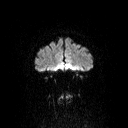
[im 70/70]
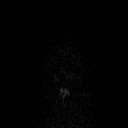

[Series 6: DWI · coronal · 5.0mm · 1.80mm/px · 3 of 35 slices shown (4 of 4)]
[im 1/35]
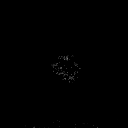
[im 18/35]
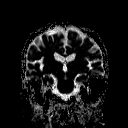
[im 35/35]
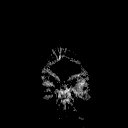

[Series 7: T2 · axial · 5.0mm · 0.51mm/px · z∈[-95,+62]mm · 2 of 24 slices shown (1 of 2)]
[im 1/24]
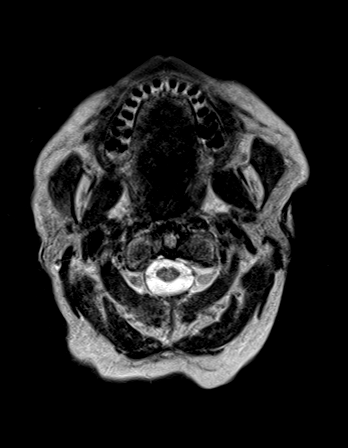
[im 24/24]
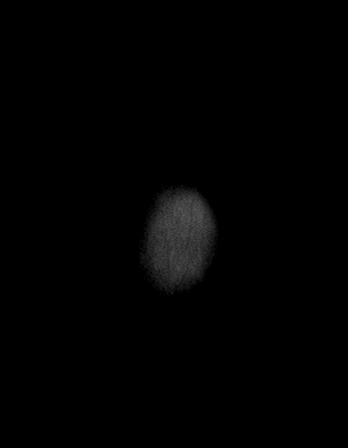

[Series 8: FLAIR · axial · 3.0mm · 0.45mm/px · z∈[-91,+55]mm · 3 of 33 slices shown (1 of 2)]
[im 1/33]
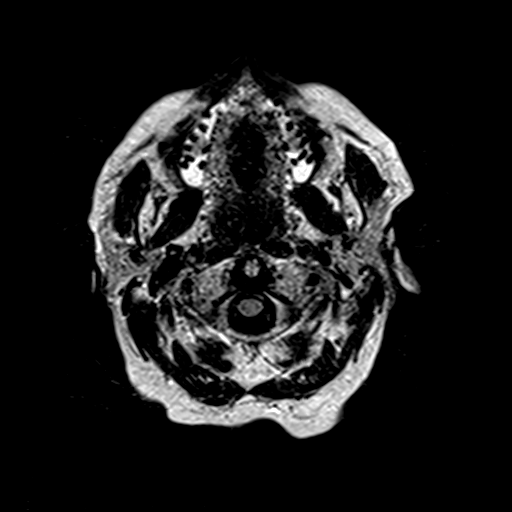
[im 17/33]
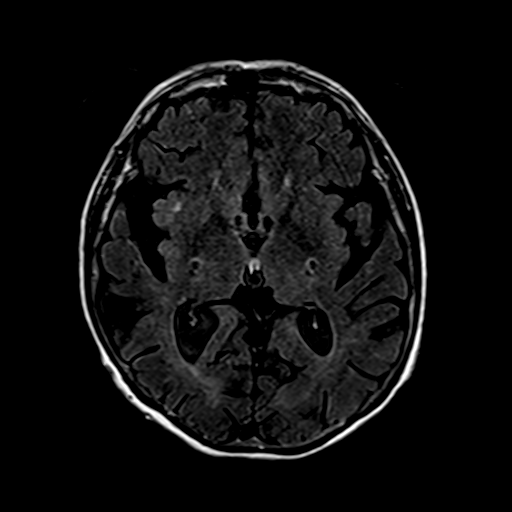
[im 33/33]
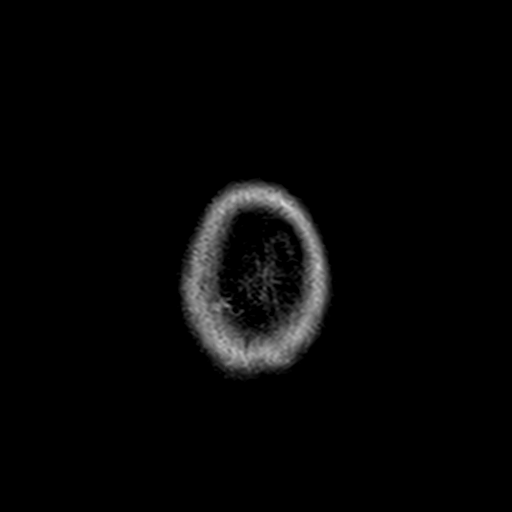

[Series 9: t1_mpr_tra · axial · 1.0mm · 0.71mm/px · z∈[-87,+53]mm · 12 of 144 slices shown]
[im 1/144]
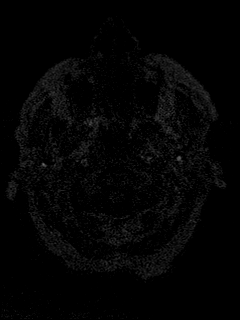
[im 14/144]
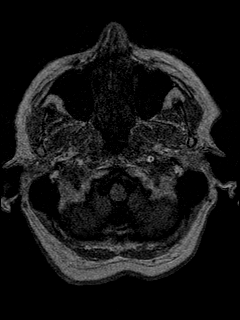
[im 27/144]
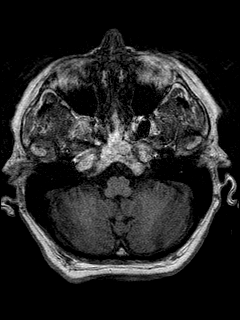
[im 40/144]
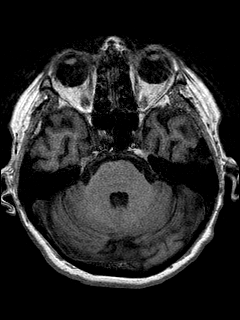
[im 53/144]
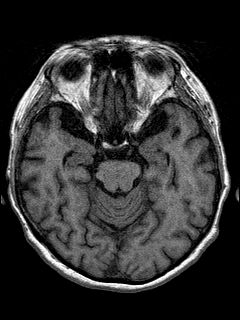
[im 66/144]
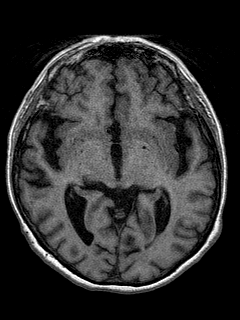
[im 79/144]
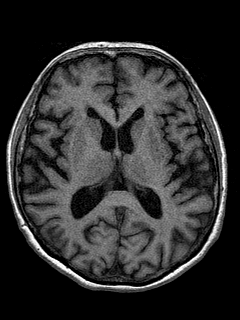
[im 92/144]
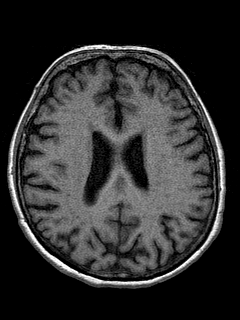
[im 105/144]
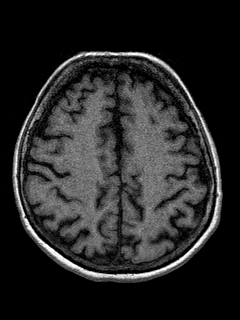
[im 118/144]
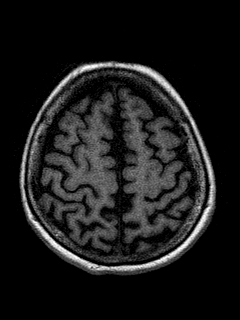
[im 131/144]
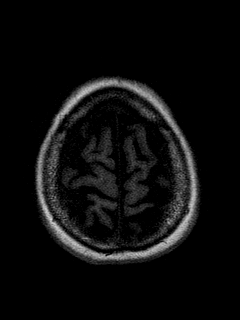
[im 144/144]
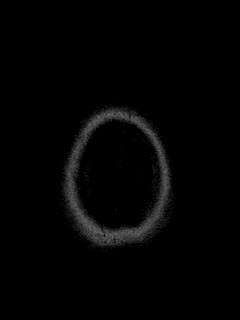

[Series 10: T2 · coronal · 5.0mm · 0.45mm/px · 2 of 25 slices shown (2 of 2)]
[im 1/25]
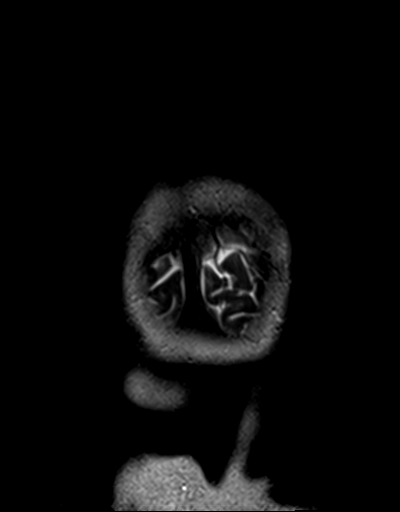
[im 25/25]
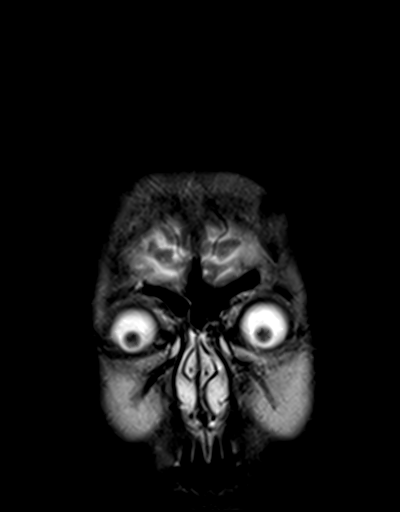

[Series 12: swi_images · axial · 4.0mm · 0.90mm/px · z∈[-94,+59]mm · 3 of 40 slices shown]
[im 1/40]
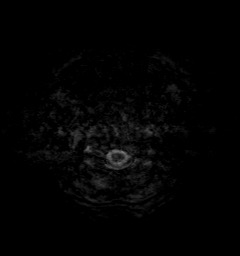
[im 20/40]
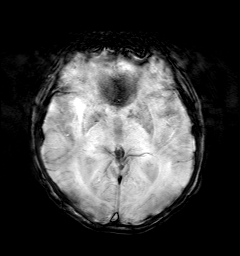
[im 40/40]
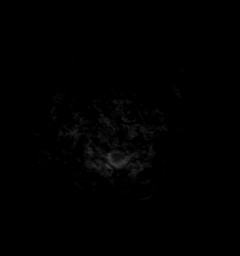

[Series 13: FLAIR · axial · 3.0mm · 0.45mm/px · z∈[-91,+55]mm · 3 of 33 slices shown (2 of 2)]
[im 1/33]
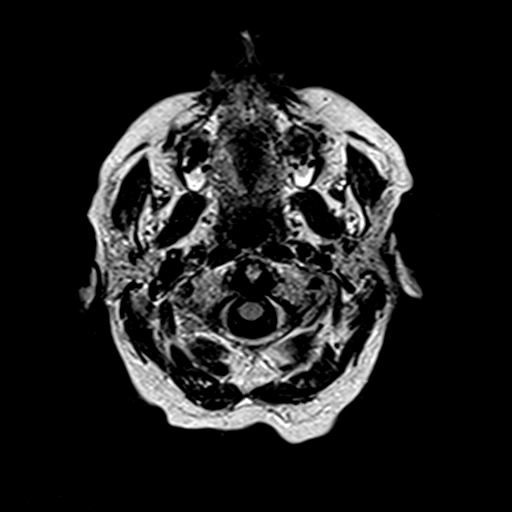
[im 17/33]
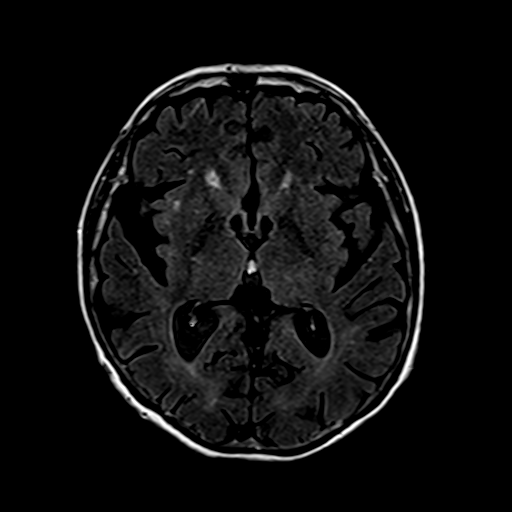
[im 33/33]
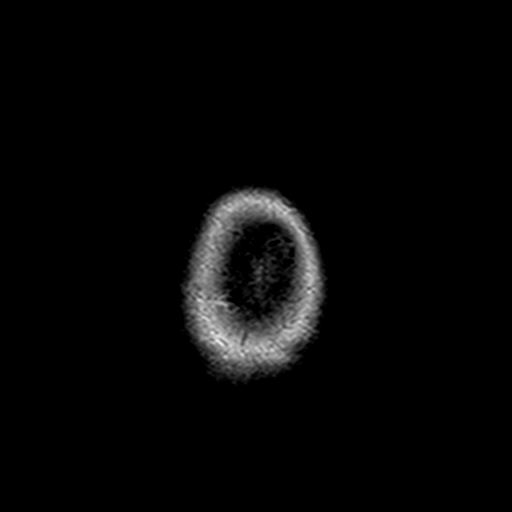

[48 of 48 positions shown; findings below may reference images not displayed]

FINDINGS: Motion limited exam.  Within this limitation:

Brain: No acute infarction, hemorrhage, hydrocephalus, extra-axial
collection or mass lesion. Moderate atrophy with possible mild
temporal lobe predominance. Mild for age scattered T2/FLAIR
hyperintensities within the supratentorial and pontine white matter,
nonspecific but most likely related to chronic microvascular
ischemic disease. Suspected small remote lacunar infarct in the left
basal ganglia.

Vascular: Major arterial flow voids are maintained at the skull
base.

Skull and upper cervical spine: Normal marrow signal.

Sinuses/Orbits: Mild ethmoid air cell mucosal thickening.

Other: Of no sizable mastoid effusions.
IMPRESSION: 1. No evidence of acute abnormality on this motion limited exam.
2. Moderate atrophy with possible mild temporal lobe predominance,
which is nonspecific but can be seen with Alzheimer's disease.
3. Mild for age chronic microvascular ischemic disease and suspected
small remote lacunar infarct in the left basal ganglia.

## 2023-08-21 ENCOUNTER — Other Ambulatory Visit: Payer: Self-pay | Admitting: Internal Medicine

## 2023-09-30 ENCOUNTER — Other Ambulatory Visit: Payer: Self-pay | Admitting: Internal Medicine

## 2023-11-24 ENCOUNTER — Encounter: Payer: Self-pay | Admitting: Internal Medicine

## 2023-11-24 NOTE — Patient Instructions (Addendum)

## 2023-11-24 NOTE — Progress Notes (Unsigned)
 Subjective:    Patient ID: Madison Wright, female    DOB: March 24, 1946, 78 y.o.   MRN: [id number]      HPI Madison Wright is here for a Physical exam and her chronic medical problems.      Medications and allergies reviewed with patient and updated if appropriate.  Current Outpatient Medications on File Prior to Visit  Medication Sig Dispense Refill   alendronate (FOSAMAX) 70 MG tablet Take 1 tablet (70 mg total) by mouth every 7 (seven) days. Take with a full glass of water on an empty stomach. 12 tablet 3   cyanocobalamin (VITAMIN B12) 1000 MCG/ML injection INJECT 1 ML (1,000 MCG TOTAL) INTO THE MUSCLE EVERY 30 DAYS. 10 mL 0   escitalopram (LEXAPRO) 10 MG tablet TAKE 1 TABLET BY MOUTH EVERY DAY 90 tablet 3   memantine (NAMENDA) 10 MG tablet TAKE ONE TAB TWO TIMES A DAY 180 tablet 4   Multiple Vitamin (MULTIVITAMIN) tablet Take 1 tablet by mouth daily.     Probiotic Product (ALIGN PO) Take by mouth. (Patient not taking: Reported on 01/13/2023)     Syringe/Needle, Disp, (SYRINGE 3CC/25GX1") 25G X 1" 3 ML MISC Use for monthly B12 injections 12 each 3   No current facility-administered medications on file prior to visit.    Review of Systems     Objective:  There were no vitals filed for this visit. There were no vitals filed for this visit. There is no height or weight on file to calculate BMI.  BP Readings from Last 3 Encounters:  11/22/22 134/80  03/12/22 (!) 158/78  11/12/21 (!) 144/72    Wt Readings from Last 3 Encounters:  11/22/22 138 lb (62.6 kg)  03/12/22 132 lb (59.9 kg)  11/12/21 132 lb 3.2 oz (60 kg)       Physical Exam Constitutional: She appears well-developed and well-nourished. No distress.  HENT:  Head: Normocephalic and atraumatic.  Right Ear: External ear normal. Normal ear canal and TM Left Ear: External ear normal.  Normal ear canal and TM Mouth/Throat: Oropharynx is clear and moist.  Eyes: Conjunctivae normal.  Neck: Neck supple. No tracheal  deviation present. No thyromegaly present.  No carotid bruit  Cardiovascular: Normal rate, regular rhythm and normal heart sounds.   No murmur heard.  No edema. Pulmonary/Chest: Effort normal and breath sounds normal. No respiratory distress. She has no wheezes. She has no rales.  Breast: deferred   Abdominal: Soft. She exhibits no distension. There is no tenderness.  Lymphadenopathy: She has no cervical adenopathy.  Skin: Skin is warm and dry. She is not diaphoretic.  Psychiatric: She has a normal mood and affect. Her behavior is normal.     Lab Results  Component Value Date   WBC 9.0 11/22/2022   HGB 12.9 11/22/2022   HCT 39.3 11/22/2022   PLT 324.0 11/22/2022   GLUCOSE 99 11/22/2022   CHOL 230 (H) 11/22/2022   TRIG 86.0 11/22/2022   HDL 75.60 11/22/2022   LDLDIRECT 128.2 09/12/2013   LDLCALC 137 (H) 11/22/2022   ALT 10 11/22/2022   AST 22 11/22/2022   NA 132 (L) 11/22/2022   K 4.1 11/22/2022   CL 101 11/22/2022   CREATININE 0.70 11/22/2022   BUN 15 11/22/2022   CO2 27 11/22/2022   TSH 1.30 11/12/2021   HGBA1C 5.9 11/22/2022         Assessment & Plan:   Physical exam: Screening blood work  ordered Exercise   Weight  Substance abuse  none   Reviewed recommended immunizations.   Health Maintenance  Topic Date Due   Zoster Vaccines- Shingrix (1 of 2) 12/26/1997   Colonoscopy  10/26/2022   MAMMOGRAM  01/15/2023   COVID-19 Vaccine (3 - 2024-25 season) 04/24/2023   Medicare Annual Wellness (AWV)  01/13/2024   INFLUENZA VACCINE  03/23/2024   DEXA SCAN  12/30/2024   DTaP/Tdap/Td (3 - Td or Tdap) 08/29/2030   Pneumonia Vaccine 70+ Years old  Completed   Hepatitis C Screening  Completed   HPV VACCINES  Aged Out          See Problem List for Assessment and Plan of chronic medical problems.

## 2023-11-25 ENCOUNTER — Ambulatory Visit: Payer: PPO | Admitting: Internal Medicine

## 2023-11-25 VITALS — BP 140/72 | HR 80 | Temp 97.9°F | Ht 65.0 in | Wt 146.0 lb

## 2023-11-25 DIAGNOSIS — E559 Vitamin D deficiency, unspecified: Secondary | ICD-10-CM | POA: Diagnosis not present

## 2023-11-25 DIAGNOSIS — E538 Deficiency of other specified B group vitamins: Secondary | ICD-10-CM | POA: Diagnosis not present

## 2023-11-25 DIAGNOSIS — Z0000 Encounter for general adult medical examination without abnormal findings: Secondary | ICD-10-CM

## 2023-11-25 DIAGNOSIS — F419 Anxiety disorder, unspecified: Secondary | ICD-10-CM | POA: Diagnosis not present

## 2023-11-25 DIAGNOSIS — R7303 Prediabetes: Secondary | ICD-10-CM | POA: Diagnosis not present

## 2023-11-25 DIAGNOSIS — F0280 Dementia in other diseases classified elsewhere without behavioral disturbance: Secondary | ICD-10-CM | POA: Diagnosis not present

## 2023-11-25 DIAGNOSIS — F32A Depression, unspecified: Secondary | ICD-10-CM

## 2023-11-25 DIAGNOSIS — M810 Age-related osteoporosis without current pathological fracture: Secondary | ICD-10-CM | POA: Diagnosis not present

## 2023-11-25 DIAGNOSIS — G309 Alzheimer's disease, unspecified: Secondary | ICD-10-CM

## 2023-11-25 LAB — CBC WITH DIFFERENTIAL/PLATELET
Basophils Absolute: 0.1 10*3/uL (ref 0.0–0.1)
Basophils Relative: 1.2 % (ref 0.0–3.0)
Eosinophils Absolute: 0.1 10*3/uL (ref 0.0–0.7)
Eosinophils Relative: 1 % (ref 0.0–5.0)
HCT: 40.8 % (ref 36.0–46.0)
Hemoglobin: 13.5 g/dL (ref 12.0–15.0)
Lymphocytes Relative: 19.7 % (ref 12.0–46.0)
Lymphs Abs: 1.2 10*3/uL (ref 0.7–4.0)
MCHC: 33.1 g/dL (ref 30.0–36.0)
MCV: 85.2 fl (ref 78.0–100.0)
Monocytes Absolute: 0.4 10*3/uL (ref 0.1–1.0)
Monocytes Relative: 6.6 % (ref 3.0–12.0)
Neutro Abs: 4.3 10*3/uL (ref 1.4–7.7)
Neutrophils Relative %: 71.5 % (ref 43.0–77.0)
Platelets: 322 10*3/uL (ref 150.0–400.0)
RBC: 4.78 Mil/uL (ref 3.87–5.11)
RDW: 14.7 % (ref 11.5–15.5)
WBC: 6.1 10*3/uL (ref 4.0–10.5)

## 2023-11-25 LAB — COMPREHENSIVE METABOLIC PANEL WITH GFR
ALT: 11 U/L (ref 0–35)
AST: 25 U/L (ref 0–37)
Albumin: 4.4 g/dL (ref 3.5–5.2)
Alkaline Phosphatase: 80 U/L (ref 39–117)
BUN: 11 mg/dL (ref 6–23)
CO2: 30 meq/L (ref 19–32)
Calcium: 9.3 mg/dL (ref 8.4–10.5)
Chloride: 102 meq/L (ref 96–112)
Creatinine, Ser: 0.63 mg/dL (ref 0.40–1.20)
GFR: 86.48 mL/min (ref >60.00)
Glucose, Bld: 105 mg/dL — ABNORMAL HIGH (ref 70–99)
Potassium: 4 meq/L (ref 3.5–5.1)
Sodium: 140 meq/L (ref 135–145)
Total Bilirubin: 0.7 mg/dL (ref 0.2–1.2)
Total Protein: 7 g/dL (ref 6.0–8.3)

## 2023-11-25 LAB — HEMOGLOBIN A1C: Hgb A1c MFr Bld: 5.9 % (ref 4.6–6.5)

## 2023-11-25 LAB — VITAMIN D 25 HYDROXY (VIT D DEFICIENCY, FRACTURES): VITD: 31.01 ng/mL (ref 30.00–100.00)

## 2023-11-25 LAB — VITAMIN B12: Vitamin B-12: 504 pg/mL (ref 211–911)

## 2023-11-25 LAB — TSH: TSH: 1.27 u[IU]/mL (ref 0.35–5.50)

## 2023-11-25 MED ORDER — SYRINGE 25G X 1" 3 ML MISC: 3 refills | Status: AC

## 2023-11-25 MED ORDER — CYANOCOBALAMIN 1000 MCG/ML IJ SOLN: 1000.0000 ug | INTRAMUSCULAR | 1 refills | Status: AC

## 2023-11-25 NOTE — Assessment & Plan Note (Signed)
 Chronic Lab Results  Component Value Date   HGBA1C 5.9 11/22/2022   Check a1c Low sugar / carb diet Stressed regular exercise

## 2023-11-25 NOTE — Assessment & Plan Note (Signed)
 Chronic Doing B12 injections monthly We will check B12 level

## 2023-11-25 NOTE — Assessment & Plan Note (Addendum)
 Chronic Fosamax started 01/2020, but has been taking it intermittently over the first 3 years Has been taking Fosamax regularly once a week DEXA up-to-date She is active Continue MVI Check vit d level Continue Fosamax 70 mg weekly

## 2023-11-25 NOTE — Assessment & Plan Note (Signed)
Chronic °Taking multivitamin daily °Check vitamin D level °

## 2023-11-25 NOTE — Assessment & Plan Note (Addendum)
 Chronic Have decided not to follow with neurology any further Currently on Namenda 10  mg twice daily

## 2023-11-25 NOTE — Assessment & Plan Note (Signed)
 Chronic Has some depression and anxiety Continue Lexapro 10 mg daily

## 2023-11-27 ENCOUNTER — Encounter: Payer: Self-pay | Admitting: Internal Medicine

## 2023-12-17 ENCOUNTER — Other Ambulatory Visit: Payer: Self-pay | Admitting: Internal Medicine

## 2024-04-06 ENCOUNTER — Ambulatory Visit

## 2024-04-20 ENCOUNTER — Ambulatory Visit

## 2024-04-20 VITALS — Ht 65.0 in | Wt 146.0 lb

## 2024-04-20 DIAGNOSIS — Z0000 Encounter for general adult medical examination without abnormal findings: Secondary | ICD-10-CM

## 2024-04-20 NOTE — Progress Notes (Signed)
 Subjective:   RANI IDLER is a 78 y.o. who presents for a Medicare Wellness preventive visit.  As a reminder, Annual Wellness Visits don't include a physical exam, and some assessments may be limited, especially if this visit is performed virtually. We may recommend an in-person follow-up visit with your provider if needed.  Visit Complete: Virtual I connected with  TORRIA FROMER on 04/20/24 by a audio enabled telemedicine application and verified that I am speaking with the correct person using two identifiers.  Patient Location: Home  Provider Location: Office/Clinic  I discussed the limitations of evaluation and management by telemedicine. The patient expressed understanding and agreed to proceed.  Vital Signs: Because this visit was a virtual/telehealth visit, some criteria may be missing or patient reported. Any vitals not documented were not able to be obtained and vitals that have been documented are patient reported.  VideoDeclined- This patient declined Librarian, academic. Therefore the visit was completed with audio only.  Persons Participating in Visit: Patient assisted by Sister & POA, Connie Bullins.  AWV Questionnaire: No: Patient Medicare AWV questionnaire was not completed prior to this visit.  Cardiac Risk Factors include: advanced age (>93men, >17 women)     Objective:    Today's Vitals   04/20/24 1037  Weight: 146 lb (66.2 kg)  Height: 5' 5 (1.651 m)   Body mass index is 24.3 kg/m.     04/20/2024   10:37 AM 03/12/2022    9:10 AM 02/01/2022    8:58 AM 01/08/2021    9:58 AM 04/30/2015    9:09 AM  Advanced Directives  Does Patient Have a Medical Advance Directive? Yes Yes Yes Yes Yes   Type of Estate agent of [place];Living will  Healthcare Power of [place];Living will Living will;Healthcare Power of Attorney   Does patient want to make changes to medical advance directive? No - Patient declined   No -  Patient declined   Copy of Healthcare Power of Attorney in Chart? Yes - validated most recent copy scanned in chart (See row information)  No - copy requested No - copy requested Yes      Data saved with a previous flowsheet row definition    Current Medications (verified) Outpatient Encounter Medications as of 04/20/2024  Medication Sig   alendronate  (FOSAMAX ) 70 MG tablet TAKE 1 TABLET (70 MG TOTAL) BY MOUTH EVERY 7 DAYS WITH FULL GLASS WATER ON EMPTY STOMACH   cyanocobalamin  (VITAMIN B12) 1000 MCG/ML injection Inject 1 mL (1,000 mcg total) into the muscle every 30 (thirty) days.   escitalopram  (LEXAPRO ) 10 MG tablet TAKE 1 TABLET BY MOUTH EVERY DAY   memantine  (NAMENDA ) 10 MG tablet TAKE ONE TAB TWO TIMES A DAY   Multiple Vitamin (MULTIVITAMIN) tablet Take 1 tablet by mouth daily.   Syringe/Needle, Disp, (SYRINGE 3CC/25GX1) 25G X 1 3 ML MISC Use for monthly B12 injections   No facility-administered encounter medications on file as of 04/20/2024.    Allergies (verified) Sulfonamide derivatives and Codeine   History: Past Medical History:  Diagnosis Date   Dementia due to Alzheimer's disease 06/09/2022   Diverticulosis of colon    Diverticulosis of large intestine 01/07/2010   Granuloma annulare 2008   Menopausal state 11/2000   Took HRT X 10 days only  in 05/2001 secondary to [facility] breast ca   Osteoporosis    Post-menopausal atrophic vaginitis 07/2005   Prediabetes 11/20/2015   Vitamin B12 deficiency 11/06/2020   Vitamin D  deficiency  Past Surgical History:  Procedure Laterality Date   COLONOSCOPY  2004  & 2014   diverticulosis, Dr Obie   SKIN CANCER EXCISION  2003   Basal skin   WRIST FRACTURE SURGERY  2012   [facility]   Family History  Problem Relation Age of Onset   Arthritis Mother    Osteoporosis Mother        died after fall with fx. hips and PE   Stroke Mother        post immobilization with vertebral fracture   Migraines Mother    COPD Father     Colon cancer Paternal Aunt 40   Heart disease Paternal Aunt        X 2; both had MI > 65   Diabetes Paternal Aunt    Heart failure Paternal Grandmother    Cancer - Colon Unknown        nephew - chemo   Social History   Socioeconomic History   Marital status: Widowed    Spouse name: Lindamarie Maclachlan   Number of children: 0   Years of education: 12   Highest education level: High school graduate  Occupational History   Occupation: Retired  Tobacco Use   Smoking status: Never   Smokeless tobacco: Never  Vaping Use   Vaping status: Never Used  Substance and Sexual Activity   Alcohol use: No    Alcohol/week: 0.0 standard drinks of alcohol   Drug use: No   Sexual activity: Not Currently    Partners: Male    Birth control/protection: Post-menopausal  Other Topics Concern   Not on file  Social History Narrative   exercises   Social Drivers of Health   Financial Resource Strain: Low Risk  (04/20/2024)   Overall Financial Resource Strain (CARDIA)    Difficulty of Paying Living Expenses: Not hard at all  Food Insecurity: No Food Insecurity (04/20/2024)   Hunger Vital Sign    Worried About Running Out of Food in the Last Year: Never true    Ran Out of Food in the Last Year: Never true  Transportation Needs: No Transportation Needs (04/20/2024)   PRAPARE - Administrator, Civil Service (Medical): No    Lack of Transportation (Non-Medical): No  Physical Activity: Inactive (04/20/2024)   Exercise Vital Sign    Days of Exercise per Week: 0 days    Minutes of Exercise per Session: 0 min  Stress: No Stress Concern Present (04/20/2024)   [employer] of Occupational Health - Occupational Stress Questionnaire    Feeling of Stress: Not at all  Social Connections: Socially Isolated (04/20/2024)   Social Connection and Isolation Panel    Frequency of Communication with Friends and Family: Once a week    Frequency of Social Gatherings with Friends and Family: Never     Attends Religious Services: Never    Database administrator or Organizations: No    Attends Banker Meetings: Never    Marital Status: Widowed    Tobacco Counseling Counseling given: Not Answered    Clinical Intake:  Pre-visit preparation completed: Yes  Pain : No/denies pain     BMI - recorded: 24.3 Nutritional Status: BMI of 19-24  Normal Nutritional Risks: None Diabetes: No  Lab Results  Component Value Date   HGBA1C 5.9 11/25/2023   HGBA1C 5.9 11/22/2022   HGBA1C 6.0 11/12/2021     How often do you need to have someone help you when you read instructions, pamphlets,  or other written materials from your doctor or pharmacy?: 5 - Always (Sister (POA) handles)  Interpreter Needed?: No  Information entered by :: Verdie Saba, CMA   Activities of Daily Living     04/20/2024   10:42 AM  In your present state of health, do you have any difficulty performing the following activities:  Hearing? 0  Vision? 0  Difficulty concentrating or making decisions? 1  Comment Sister (POA) handles  Walking or climbing stairs? 0  Dressing or bathing? 0  Doing errands, shopping? 1  Comment Sister (POA) Therapist, music and eating ? N  Using the Toilet? N  In the past six months, have you accidently leaked urine? Y  Comment wears a depend  Do you have problems with loss of bowel control? N  Managing your Medications? Y  Comment Sister (POA) handles  Managing your Finances? N  Housekeeping or managing your Housekeeping? Y  Comment Sister (POA) handles    Patient Care Team: Geofm Glade PARAS, MD as PCP - General (Internal Medicine) Dingeldein, Elspeth, MD as Consulting Physician (Ophthalmology)  I have updated your Care Teams any recent Medical Services you may have received from other providers in the past year.     Assessment:   This is a routine wellness examination for Sally.  Hearing/Vision screen Hearing Screening - Comments:: Denies hearing  difficulties   Vision Screening - Comments:: Wears rx glasses - up to date with routine eye exams with [facility]   Goals Addressed   None    Depression Screen     04/20/2024   10:44 AM 11/25/2023    8:42 AM 11/22/2022    2:41 PM 02/01/2022    8:59 AM 02/01/2022    8:57 AM 01/08/2021    9:57 AM 08/29/2020    8:30 AM  PHQ 2/9 Scores  PHQ - 2 Score 0 0 2 0 0 0 0  PHQ- 9 Score 3  5        Fall Risk     04/20/2024   10:43 AM 11/25/2023    8:41 AM 01/13/2023    3:40 PM 11/22/2022    2:41 PM 03/12/2022    9:10 AM  Fall Risk   Falls in the past year? 1 0 1 1 0  Number falls in past yr: 1 0 0 0 0  Comment 5      Injury with Fall? 0 0 0 0 0  Risk for fall due to : Mental status change;Other (Comment) No Fall Risks Impaired balance/gait No Fall Risks   Risk for fall due to: Comment Dementia dx      Follow up Falls evaluation completed;Falls prevention discussed Falls evaluation completed Falls evaluation completed Falls evaluation completed     MEDICARE RISK AT HOME:  Medicare Risk at Home Any stairs in or around the home?: Yes (outside) If so, are there any without handrails?: No Home free of loose throw rugs in walkways, pet beds, electrical cords, etc?: Yes Adequate lighting in your home to reduce risk of falls?: Yes Life alert?: No Use of a cane, walker or w/c?: No Grab bars in the bathroom?: Yes Shower chair or bench in shower?: No Elevated toilet seat or a handicapped toilet?: No  TIMED UP AND GO:  Was the test performed?  No  Cognitive Function: Impaired: Patient has current diagnosis of cognitive impairment.    04/20/2024   10:44 AM  MMSE - Mini Mental State Exam  Not completed: Unable to  complete      03/12/2022    2:00 PM  Montreal Cognitive Assessment   Visuospatial/ Executive (0/5) 1  Naming (0/3) 3  Attention: Read list of digits (0/2) 2  Attention: Read list of letters (0/1) 1  Attention: Serial 7 subtraction starting at 100 (0/3) 2  Language: Repeat  phrase (0/2) 2  Language : Fluency (0/1) 0  Abstraction (0/2) 1  Delayed Recall (0/5) 2  Orientation (0/6) 4  Total 18  Adjusted Score (based on education) 18      Immunizations Immunization History  Administered Date(s) Administered   Fluad Quad(high Dose 65+) 05/18/2019, 06/18/2021   INFLUENZA, HIGH DOSE SEASONAL PF 06/13/2013, 04/30/2015, 06/02/2016, 06/11/2017, 06/16/2018   Influenza Split 08/08/2012   Influenza,inj,Quad PF,6+ Mos 06/13/2014   Moderna Sars-Covid-2 Vaccination 09/28/2019, 10/24/2019   Pneumococcal Conjugate-13 04/30/2015   Pneumococcal Polysaccharide-23 09/12/2013   Td 03/06/2008   Tdap 08/29/2020   Zoster, Live 03/03/2012    Screening Tests Health Maintenance  Topic Date Due   Zoster Vaccines- Shingrix (1 of 2) 12/26/1997   COVID-19 Vaccine (3 - 2024-25 season) 04/24/2023   INFLUENZA VACCINE  03/23/2024   MAMMOGRAM  11/24/2024 (Originally 01/15/2023)   DEXA SCAN  12/30/2024   Medicare Annual Wellness (AWV)  04/20/2025   DTaP/Tdap/Td (3 - Td or Tdap) 08/29/2030   Pneumococcal Vaccine: 50+ Years  Completed   Hepatitis C Screening  Completed   HPV VACCINES  Aged Out   Meningococcal B Vaccine  Aged Out   Colonoscopy  Discontinued    Health Maintenance  Health Maintenance Due  Topic Date Due   Zoster Vaccines- Shingrix (1 of 2) 12/26/1997   COVID-19 Vaccine (3 - 2024-25 season) 04/24/2023   INFLUENZA VACCINE  03/23/2024   Health Maintenance Items Addressed:  04/20/2024  Additional Screening:  Vision Screening: Recommended annual ophthalmology exams for early detection of glaucoma and other disorders of the eye. Would you like a referral to an eye doctor? No    Dental Screening: Recommended annual dental exams for proper oral hygiene  Community Resource Referral / Chronic Care Management: CRR required this visit?  No   CCM required this visit?  No   Plan:    I have personally reviewed and noted the following in the patient's chart:    Medical and social history Use of alcohol, tobacco or illicit drugs  Current medications and supplements including opioid prescriptions. Patient is not currently taking opioid prescriptions. Functional ability and status Nutritional status Physical activity Advanced directives List of other physicians Hospitalizations, surgeries, and ER visits in previous 12 months Vitals Screenings to include cognitive, depression, and falls Referrals and appointments  In addition, I have reviewed and discussed with patient certain preventive protocols, quality metrics, and best practice recommendations. A written personalized care plan for preventive services as well as general preventive health recommendations were provided to patient.   Verdie CHRISTELLA Saba, CMA   04/20/2024   After Visit Summary: (MyChart) Due to this being a telephonic visit, the after visit summary with patients personalized plan was offered to patient via MyChart   Notes: Pt's Sister/POA declined to schedule a 2026 AWV appt due to changing PCP.

## 2024-04-20 NOTE — Patient Instructions (Addendum)
 Madison Wright , Thank you for taking time out of your busy schedule to complete your Annual Wellness Visit with me. I enjoyed our conversation and look forward to speaking with you again next year. I, as well as your care team,  appreciate your ongoing commitment to your health goals. Please review the following plan we discussed and let me know if I can assist you in the future. Your Game plan/ To Do List    Referrals: If you haven't heard from the office you've been referred to, please reach out to them at the phone provided.   Follow up Visits: We will see or speak with you next year for your Next Medicare AWV with our clinical staff Have you seen your provider in the last 6 months (3 months if uncontrolled diabetes)? Yes  Clinician Recommendations:  Aim for 30 minutes of exercise or brisk walking, 6-8 glasses of water, and 5 servings of fruits and vegetables each day. Educated and advised on getting the Shingles vaccines in 2025.      This is a list of the screenings recommended for you:  Health Maintenance  Topic Date Due   Zoster (Shingles) Vaccine (1 of 2) 12/26/1997   COVID-19 Vaccine (3 - 2024-25 season) 04/24/2023   Flu Shot  03/23/2024   Mammogram  11/24/2024*   DEXA scan (bone density measurement)  12/30/2024   Medicare Annual Wellness Visit  04/20/2025   DTaP/Tdap/Td vaccine (3 - Td or Tdap) 08/29/2030   Pneumococcal Vaccine for age over 41  Completed   Hepatitis C Screening  Completed   HPV Vaccine  Aged Out   Meningitis B Vaccine  Aged Out   Colon Cancer Screening  Discontinued  *Topic was postponed. The date shown is not the original due date.    Advanced directives: (In Chart) A copy of your advanced directives are scanned into your chart should your provider ever need it. Advance Care Planning is important because it:  [x]  Makes sure you receive the medical care that is consistent with your values, goals, and preferences  [x]  It provides guidance to your family and  loved ones and reduces their decisional burden about whether or not they are making the right decisions based on your wishes.  Follow the link provided in your after visit summary or read over the paperwork we have mailed to you to help you started getting your Advance Directives in place. If you need assistance in completing these, please reach out to us  so that we can help you!

## 2024-09-22 ENCOUNTER — Other Ambulatory Visit: Payer: Self-pay | Admitting: Internal Medicine
